# Patient Record
Sex: Female | Born: 1955 | Race: Black or African American | Hispanic: No | Marital: Married | Smoking: Current some day smoker
Health system: Southern US, Community
[De-identification: ages and names within clinical notes are randomized; demographics above are authoritative.]

## PROBLEM LIST (undated history)

## (undated) DIAGNOSIS — I10 Essential (primary) hypertension: Secondary | ICD-10-CM

## (undated) DIAGNOSIS — K219 Gastro-esophageal reflux disease without esophagitis: Secondary | ICD-10-CM

## (undated) DIAGNOSIS — E079 Disorder of thyroid, unspecified: Secondary | ICD-10-CM

## (undated) HISTORY — PX: THYROID SURGERY: SHX805

## (undated) HISTORY — DX: Gastro-esophageal reflux disease without esophagitis: K21.9

---

## 2017-02-22 ENCOUNTER — Ambulatory Visit (HOSPITAL_COMMUNITY)
Admission: RE | Admit: 2017-02-22 | Discharge: 2017-02-22 | Disposition: A | Discharge status: Home / Self Care | Payer: BLUE CROSS/BLUE SHIELD | Source: Ambulatory Visit | Attending: Family Medicine | Admitting: Family Medicine

## 2017-02-22 ENCOUNTER — Other Ambulatory Visit (HOSPITAL_COMMUNITY): Payer: Self-pay | Admitting: Family Medicine

## 2017-02-22 DIAGNOSIS — N6489 Other specified disorders of breast: Secondary | ICD-10-CM | POA: Insufficient documentation

## 2017-02-22 DIAGNOSIS — Z1231 Encounter for screening mammogram for malignant neoplasm of breast: Secondary | ICD-10-CM | POA: Insufficient documentation

## 2017-03-05 ENCOUNTER — Ambulatory Visit (HOSPITAL_COMMUNITY)
Admission: RE | Admit: 2017-03-05 | Discharge: 2017-03-05 | Disposition: A | Discharge status: Home / Self Care | Payer: BLUE CROSS/BLUE SHIELD | Source: Ambulatory Visit | Attending: Family Medicine | Admitting: Family Medicine

## 2017-03-05 ENCOUNTER — Other Ambulatory Visit (HOSPITAL_COMMUNITY): Payer: Self-pay | Admitting: Family Medicine

## 2017-03-05 DIAGNOSIS — K5900 Constipation, unspecified: Secondary | ICD-10-CM | POA: Insufficient documentation

## 2017-08-11 ENCOUNTER — Emergency Department (HOSPITAL_COMMUNITY)
Admission: EM | Admit: 2017-08-11 | Discharge: 2017-08-11 | Disposition: A | Discharge status: Home / Self Care | Payer: 59 | Attending: Emergency Medicine | Admitting: Emergency Medicine

## 2017-08-11 ENCOUNTER — Encounter (HOSPITAL_COMMUNITY): Payer: Self-pay | Admitting: Emergency Medicine

## 2017-08-11 ENCOUNTER — Emergency Department (HOSPITAL_COMMUNITY): Payer: 59

## 2017-08-11 DIAGNOSIS — W19XXXA Unspecified fall, initial encounter: Secondary | ICD-10-CM | POA: Insufficient documentation

## 2017-08-11 DIAGNOSIS — Y939 Activity, unspecified: Secondary | ICD-10-CM | POA: Diagnosis not present

## 2017-08-11 DIAGNOSIS — Z23 Encounter for immunization: Secondary | ICD-10-CM | POA: Insufficient documentation

## 2017-08-11 DIAGNOSIS — I10 Essential (primary) hypertension: Secondary | ICD-10-CM | POA: Diagnosis not present

## 2017-08-11 DIAGNOSIS — Y999 Unspecified external cause status: Secondary | ICD-10-CM | POA: Diagnosis not present

## 2017-08-11 DIAGNOSIS — Y929 Unspecified place or not applicable: Secondary | ICD-10-CM | POA: Diagnosis not present

## 2017-08-11 DIAGNOSIS — S80211A Abrasion, right knee, initial encounter: Secondary | ICD-10-CM | POA: Diagnosis not present

## 2017-08-11 DIAGNOSIS — S8991XA Unspecified injury of right lower leg, initial encounter: Secondary | ICD-10-CM | POA: Diagnosis present

## 2017-08-11 HISTORY — DX: Essential (primary) hypertension: I10

## 2017-08-11 HISTORY — DX: Disorder of thyroid, unspecified: E07.9

## 2017-08-11 MED ORDER — MUPIROCIN CALCIUM 2 % EX CREA: 1.0000 "application " | TOPICAL_CREAM | Freq: Two times a day (BID) | CUTANEOUS | 0 refills | Status: DC

## 2017-08-11 MED ORDER — TETANUS-DIPHTH-ACELL PERTUSSIS 5-2.5-18.5 LF-MCG/0.5 IM SUSP
0.5000 mL | Freq: Once | INTRAMUSCULAR | Status: AC
  Administered 2017-08-11: 0.5 mL via INTRAMUSCULAR
  Filled 2017-08-11: qty 0.5

## 2017-08-11 MED ORDER — IBUPROFEN 800 MG PO TABS: 800.0000 mg | ORAL_TABLET | Freq: Three times a day (TID) | ORAL | 0 refills | Status: DC

## 2017-08-11 MED ORDER — MUPIROCIN CALCIUM 2 % EX CREA: TOPICAL_CREAM | Freq: Two times a day (BID) | CUTANEOUS | Status: DC

## 2017-08-11 NOTE — ED Provider Notes (Signed)
[facility] DEPT Provider Note   CSN: [id number] Arrival date & time: 08/11/17  1148     History   Chief Complaint Chief Complaint  Patient presents with  . Wound Check    HPI Ann Reeves is a 61 y.o. female.  The history is provided by the patient. No language interpreter was used.  Wound Check  This is a new problem. The current episode started 2 days ago. The problem occurs constantly. The problem has been gradually worsening. Nothing aggravates the symptoms. Nothing relieves the symptoms. She has tried nothing for the symptoms. The treatment provided no relief.   Pt fell on Friday and hit her knee.  Pt complains of swelling and pain.  Pt has an abrasion that is draining some now Past Medical History:  Diagnosis Date  . Hypertension   . Thyroid disease     There are no active problems to display for this patient.   Past Surgical History:  Procedure Laterality Date  . THYROID SURGERY      OB History    No data available       Home Medications    Prior to Admission medications   Not on File    Family History No family history on file.  Social History Social History  Substance Use Topics  . Smoking status: Not on file  . Smokeless tobacco: Not on file  . Alcohol use Not on file     Allergies   Patient has no known allergies.   Review of Systems Review of Systems  All other systems reviewed and are negative.    Physical Exam Updated Vital Signs BP 119/77 (BP Location: Right Arm)   Pulse 76   Temp (!) 97.3 F (36.3 C) (Oral)   Resp 18   Ht 5\' 6"  (1.676 m)   Wt 68 kg (150 lb)   SpO2 100%   BMI 24.21 kg/m   Physical Exam  Constitutional: She appears well-developed and well-nourished.  HENT:  Head: Normocephalic.  Cardiovascular: Normal rate.   Pulmonary/Chest: Effort normal.  Abdominal: Soft.  Musculoskeletal: Normal range of motion.  Neurological: She is alert.  Skin: There is erythema.  Abrasion right knee,  Slight  erythema,  Drainage form wound  Psychiatric: She has a normal mood and affect.  Nursing note and vitals reviewed.    ED Treatments / Results  Labs (all labs ordered are listed, but only abnormal results are displayed) Labs Reviewed - No data to display  EKG  EKG Interpretation None       Radiology Dg Knee Complete 4 Views Right  Result Date: 08/11/2017 CLINICAL DATA:  Initial encounter for FALL, RIGHT KNEE PAIN, ABRASION, Pt fell on Friday on rt knee, abrasion to rt knee has some yellow drainage around top. HISTORY OF HTN EXAM: RIGHT KNEE - COMPLETE 4+ VIEW COMPARISON:  None. FINDINGS: No acute fracture or dislocation. No joint effusion. Enthesophyte at the quadriceps insertion. IMPRESSION: No acute osseous abnormality. Electronically Signed   By: Abigail Miyamoto M.D.   On: 08/11/2017 14:05    Procedures Procedures (including critical care time)  Medications Ordered in ED Medications  Tdap (BOOSTRIX) injection 0.5 mL (0.5 mLs Intramuscular Given 08/11/17 1311)     Initial Impression / Assessment and Plan / ED Course  I have reviewed the triage vital signs and the nursing notes.  Pertinent labs & imaging results that were available during my care of the patient were reviewed by me and considered in my medical decision making (  see chart for details).       Final Clinical Impressions(s) / ED Diagnoses   Final diagnoses:  Abrasion, right knee, initial encounter    New Prescriptions New Prescriptions   IBUPROFEN (ADVIL,MOTRIN) 800 MG TABLET    Take 1 tablet (800 mg total) by mouth 3 (three) times daily.   An After Visit Summary was printed and given to the patient.   Fransico Meadow, PA-C 08/11/17 1422    Fransico Meadow, [place] 08/11/17 1429    Margette Fast, MD 08/11/17 2025

## 2017-08-11 NOTE — ED Notes (Addendum)
Well healing scar noted to right knee.  Denies fever or chills.  No foul smell noted.  Pt has been cleaning area, using peroxide and neosporin with dressing applied.  Limited ROM with knee, pain increased 6/10 with movement.

## 2017-08-11 NOTE — ED Triage Notes (Signed)
Pt fell on Friday on rt knee, abrasion to rt knee has some yellow drainage around top.

## 2018-02-05 IMAGING — DX DG ABDOMEN 2V
3 series · 3 of 3 positions shown · non-contrast
Comparison: None.

CLINICAL DATA: Constipation.

EXAM:
ABDOMEN - 2 VIEW

[abdomen erect]
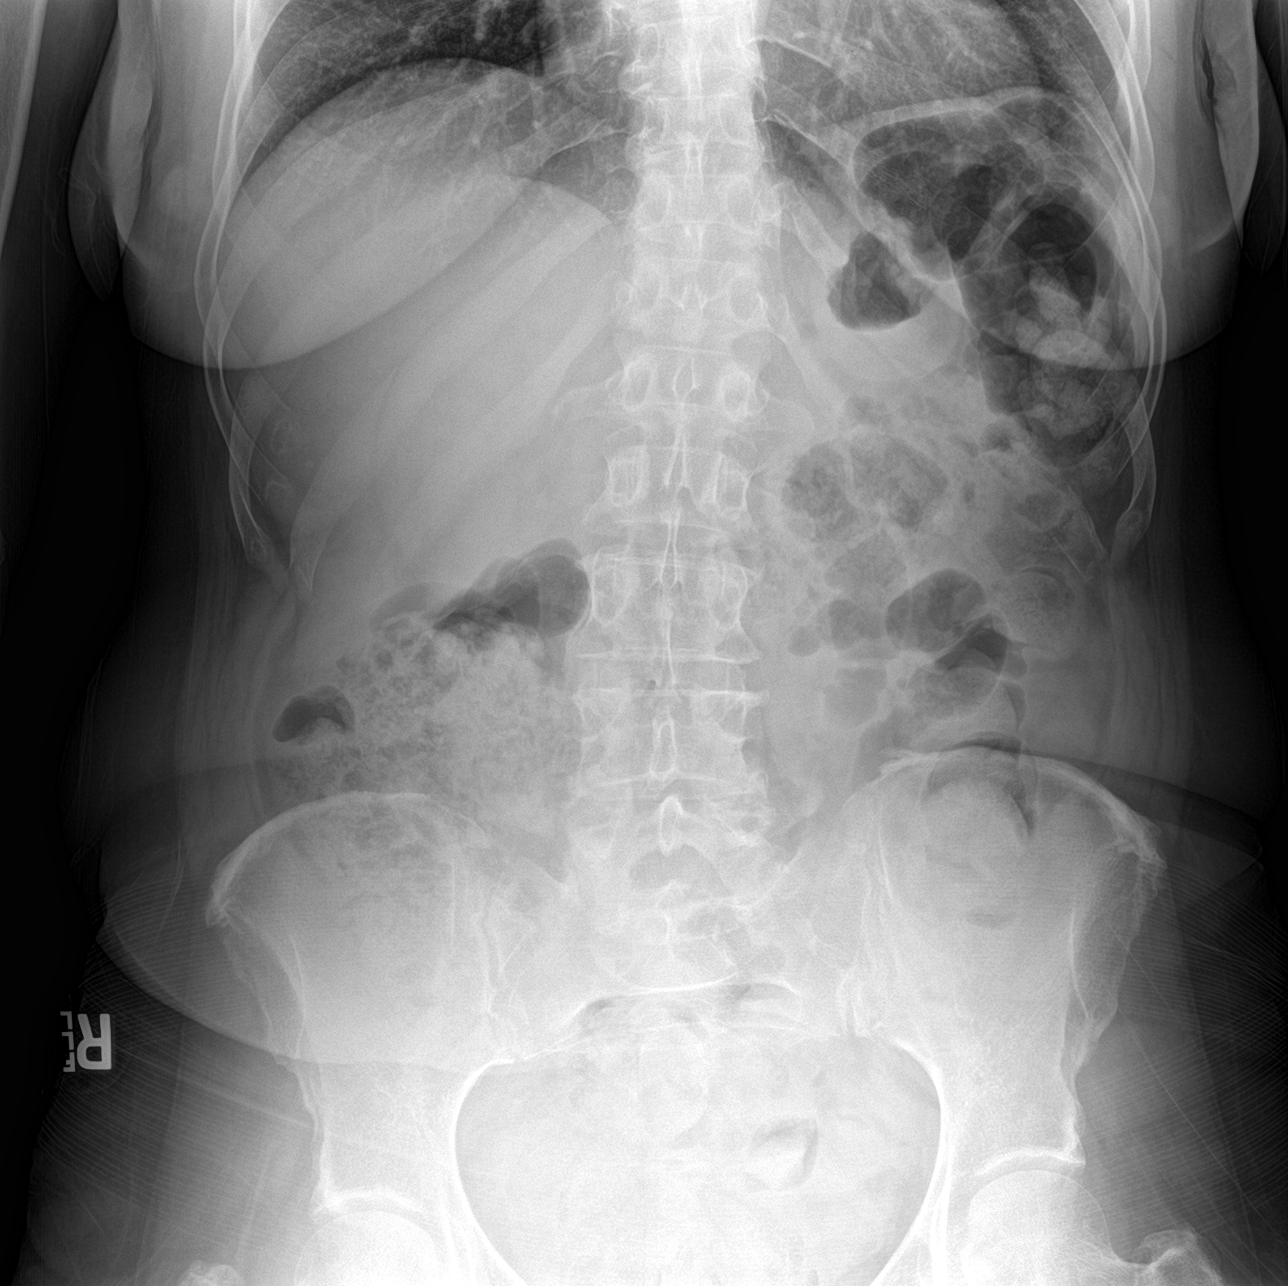

[abdomen supine (1 of 2)]
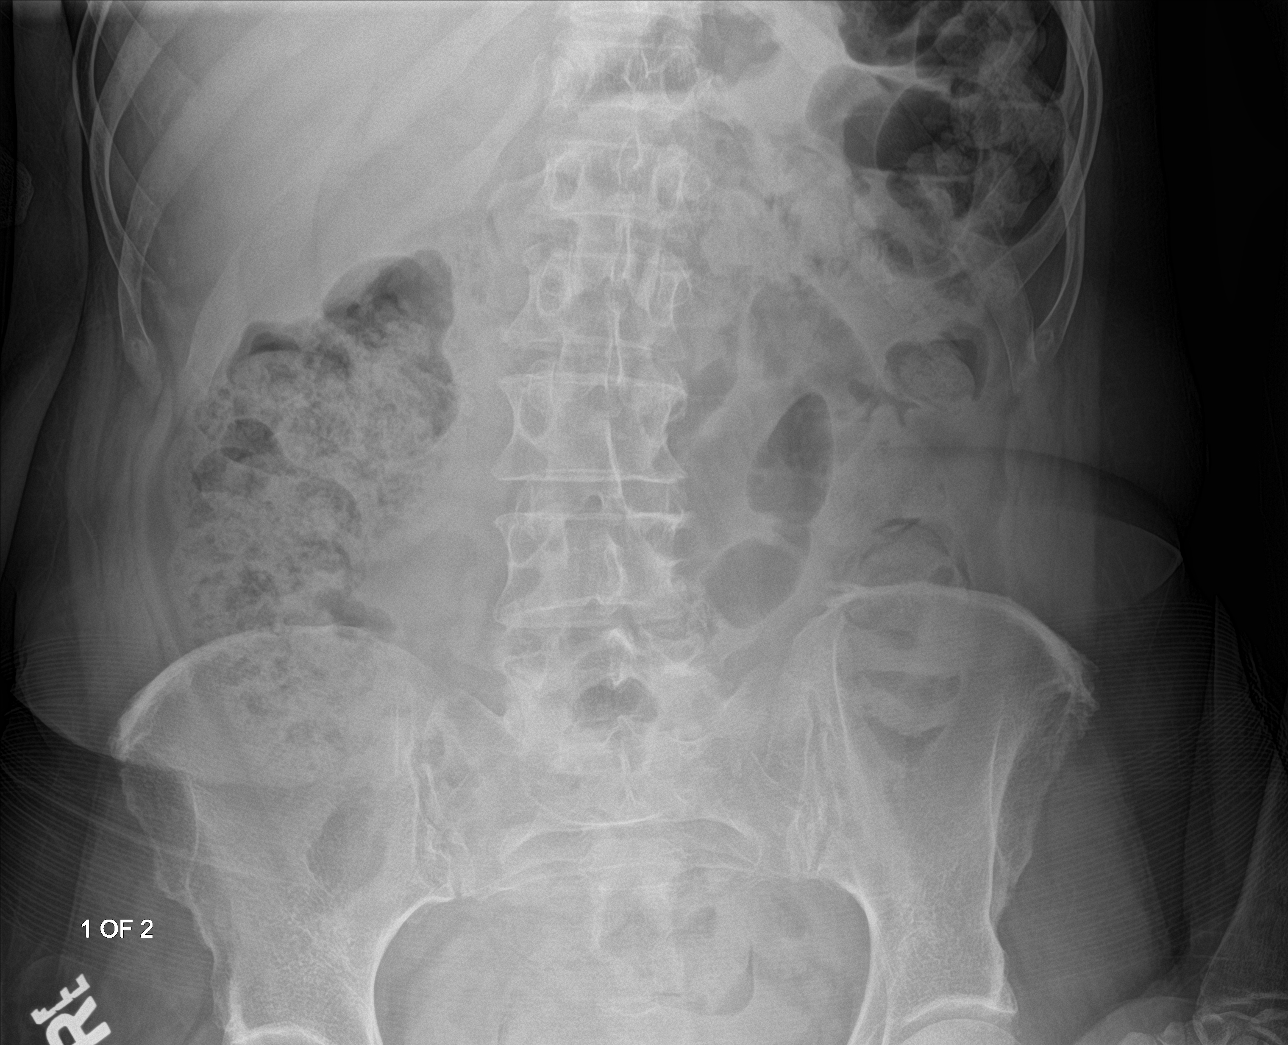

[abdomen supine (2 of 2)]
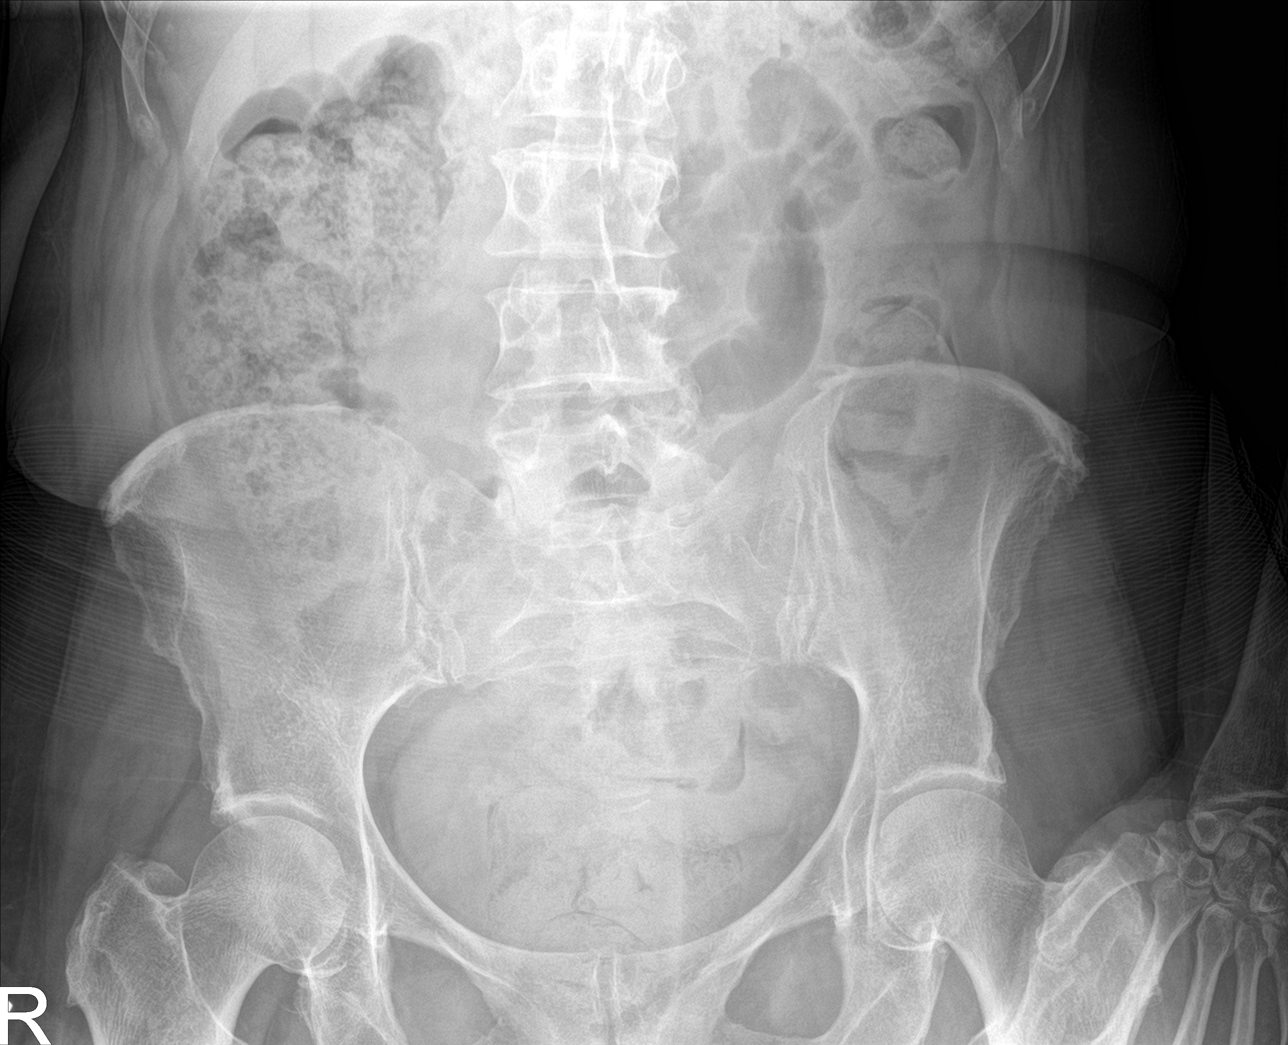

[3 of 3 positions shown; findings below may reference images not displayed]

FINDINGS: No abnormal bowel dilatation is noted. Large amount of stool seen
throughout the colon. There is no evidence of free air. No
radio-opaque calculi or other significant radiographic abnormality
is seen.
IMPRESSION: Large stool burden is noted suggesting constipation.

## 2019-02-04 ENCOUNTER — Other Ambulatory Visit (HOSPITAL_COMMUNITY): Payer: Self-pay | Admitting: Family Medicine

## 2019-02-04 DIAGNOSIS — Z1231 Encounter for screening mammogram for malignant neoplasm of breast: Secondary | ICD-10-CM

## 2019-02-12 ENCOUNTER — Ambulatory Visit (HOSPITAL_COMMUNITY)
Admission: RE | Admit: 2019-02-12 | Discharge: 2019-02-12 | Disposition: A | Discharge status: Home / Self Care | Payer: BLUE CROSS/BLUE SHIELD | Source: Ambulatory Visit | Attending: Family Medicine | Admitting: Family Medicine

## 2019-02-12 ENCOUNTER — Encounter (HOSPITAL_COMMUNITY): Payer: Self-pay

## 2019-02-12 ENCOUNTER — Other Ambulatory Visit: Payer: Self-pay

## 2019-02-12 DIAGNOSIS — Z1231 Encounter for screening mammogram for malignant neoplasm of breast: Secondary | ICD-10-CM | POA: Diagnosis not present

## 2020-01-05 ENCOUNTER — Other Ambulatory Visit (HOSPITAL_COMMUNITY): Payer: Self-pay | Admitting: Family Medicine

## 2020-01-05 DIAGNOSIS — Z1231 Encounter for screening mammogram for malignant neoplasm of breast: Secondary | ICD-10-CM

## 2020-02-18 ENCOUNTER — Other Ambulatory Visit: Payer: Self-pay

## 2020-02-18 ENCOUNTER — Ambulatory Visit (HOSPITAL_COMMUNITY)
Admission: RE | Admit: 2020-02-18 | Discharge: 2020-02-18 | Disposition: A | Discharge status: Home / Self Care | Payer: BLUE CROSS/BLUE SHIELD | Source: Ambulatory Visit | Attending: Family Medicine | Admitting: Family Medicine

## 2020-02-18 DIAGNOSIS — Z1231 Encounter for screening mammogram for malignant neoplasm of breast: Secondary | ICD-10-CM | POA: Insufficient documentation

## 2020-03-24 ENCOUNTER — Ambulatory Visit (HOSPITAL_COMMUNITY): Payer: BLUE CROSS/BLUE SHIELD

## 2020-04-07 ENCOUNTER — Ambulatory Visit (HOSPITAL_COMMUNITY): Payer: No Typology Code available for payment source

## 2020-08-31 ENCOUNTER — Ambulatory Visit (HOSPITAL_COMMUNITY)
Admission: RE | Admit: 2020-08-31 | Discharge: 2020-08-31 | Disposition: A | Discharge status: Home / Self Care | Payer: Medicare Other | Source: Ambulatory Visit | Attending: Family Medicine | Admitting: Family Medicine

## 2020-08-31 ENCOUNTER — Other Ambulatory Visit: Payer: Self-pay

## 2020-08-31 DIAGNOSIS — Z1231 Encounter for screening mammogram for malignant neoplasm of breast: Secondary | ICD-10-CM | POA: Diagnosis not present

## 2021-01-13 ENCOUNTER — Encounter: Payer: Self-pay | Admitting: Internal Medicine

## 2021-02-22 ENCOUNTER — Ambulatory Visit (INDEPENDENT_AMBULATORY_CARE_PROVIDER_SITE_OTHER): Payer: Medicare Other | Admitting: Nurse Practitioner

## 2021-02-22 ENCOUNTER — Other Ambulatory Visit: Payer: Self-pay

## 2021-02-22 ENCOUNTER — Encounter: Payer: Self-pay | Admitting: Nurse Practitioner

## 2021-02-22 DIAGNOSIS — R1013 Epigastric pain: Secondary | ICD-10-CM | POA: Diagnosis not present

## 2021-02-22 DIAGNOSIS — K279 Peptic ulcer, site unspecified, unspecified as acute or chronic, without hemorrhage or perforation: Secondary | ICD-10-CM | POA: Insufficient documentation

## 2021-02-22 DIAGNOSIS — R109 Unspecified abdominal pain: Secondary | ICD-10-CM | POA: Insufficient documentation

## 2021-02-22 DIAGNOSIS — K219 Gastro-esophageal reflux disease without esophagitis: Secondary | ICD-10-CM | POA: Insufficient documentation

## 2021-02-22 MED ORDER — DEXLANSOPRAZOLE 60 MG PO CPDR: 60.0000 mg | DELAYED_RELEASE_CAPSULE | Freq: Every day | ORAL | 3 refills | Status: DC

## 2021-02-22 MED ORDER — LIDOCAINE VISCOUS HCL 2 % MT SOLN: 15.0000 mL | Freq: Two times a day (BID) | OROMUCOSAL | 1 refills | Status: DC | PRN

## 2021-02-22 NOTE — H&P (View-Only) (Signed)
Primary Care Physician:  Lucia Gaskins, MD Primary Gastroenterologist:  Dr. Abbey Chatters  Chief Complaint  Patient presents with  . Gastroesophageal Reflux    Burning, pain in chest. On pantoprazole x 1 yr but does not help    HPI:   Ann Reeves is a 65 y.o. female who presents on referral from primary care for GERD.  No history of colonoscopy or endoscopy in our system.  Today she states she doing okay overall. States she's having significant gERD symptoms. Has made appropriate dietary/lifestyle changes. She is not obese. Has been on Protonix twice a day for about a year with no improvement. She also elevates the head of her bed at night. Typical symptoms include chest/esophageal burning, epigastric pain. Denies N/V, hematochezia, melena, fever, chills, unintentional weight loss. Denies URI or flu-like symptoms. Denies loss of sense of taste or smell. The patient has received COVID-19 vaccination(s). They have also had a booster dose. Denies chest pain, dyspnea, dizziness, lightheadedness, syncope, near syncope. Denies any other upper or lower GI symptoms.   Last colonoscopy was last year at [facility], although records aren't available.  Past Medical History:  Diagnosis Date  . Hypertension   . Thyroid disease     Past Surgical History:  Procedure Laterality Date  . THYROID SURGERY      Current Outpatient Medications  Medication Sig Dispense Refill  . dexlansoprazole (DEXILANT) 60 MG capsule Take 1 capsule (60 mg total) by mouth daily. 90 capsule 3  . levothyroxine (EUTHYROX) 175 MCG tablet Take 175 mcg by mouth daily before breakfast.    . lidocaine (XYLOCAINE) 2 % solution Use as directed 15 mLs in the mouth or throat 2 (two) times daily as needed (chest/esophagus pain. May cause mouth/tongue numbness). 100 mL 1  . meloxicam (MOBIC) 7.5 MG tablet Take 7.5 mg by mouth daily.     No current facility-administered medications for this visit.    Allergies as of  02/22/2021  . (No Known Allergies)    History reviewed. No pertinent family history.  Social History   Socioeconomic History  . Marital status: Married    Spouse name: Not on file  . Number of children: Not on file  . Years of education: Not on file  . Highest education level: Not on file  Occupational History  . Not on file  Tobacco Use  . Smoking status: Former Smoker    Types: Cigarettes  . Smokeless tobacco: Never Used  Substance and Sexual Activity  . Alcohol use: Yes    Comment: occas  . Drug use: Never  . Sexual activity: Not on file  Other Topics Concern  . Not on file  Social History Narrative  . Not on file   Social Determinants of Health   Financial Resource Strain: Not on file  Food Insecurity: Not on file  Transportation Needs: Not on file  Physical Activity: Not on file  Stress: Not on file  Social Connections: Not on file  Intimate Partner Violence: Not on file    Subjective: Review of Systems  Constitutional: Negative for chills, fever, malaise/fatigue and weight loss.  HENT: Negative for congestion and sore throat.   Respiratory: Negative for cough and shortness of breath.   Cardiovascular: Negative for chest pain and palpitations.  Gastrointestinal: Positive for heartburn. Negative for abdominal pain, blood in stool, diarrhea, melena, nausea and vomiting.  Musculoskeletal: Negative for joint pain and myalgias.  Skin: Negative for rash.  Neurological: Negative for dizziness and weakness.  Endo/Heme/Allergies: Does not bruise/bleed easily.  Psychiatric/Behavioral: Negative for depression. The patient is not nervous/anxious.   All other systems reviewed and are negative.      Objective: BP 131/83   Pulse 66   Temp 97.9 F (36.6 C)   Ht _0  (1.676 m)   Wt 172 lb 12.8 oz (78.4 kg)   BMI 27.89 kg/m  Physical Exam Vitals and nursing note reviewed.  Constitutional:      General: She is not in acute distress.    Appearance: Normal  appearance. She is well-developed and normal weight. She is not ill-appearing, toxic-appearing or diaphoretic.  HENT:     Head: Normocephalic and atraumatic.     Nose: No congestion or rhinorrhea.  Eyes:     General: No scleral icterus. Cardiovascular:     Rate and Rhythm: Normal rate and regular rhythm.     Heart sounds: Normal heart sounds.  Pulmonary:     Effort: Pulmonary effort is normal. No respiratory distress.     Breath sounds: Normal breath sounds.  Abdominal:     General: Bowel sounds are normal.     Palpations: Abdomen is soft. There is no hepatomegaly, splenomegaly or mass.     Tenderness: There is no abdominal tenderness. There is no guarding or rebound.     Hernia: No hernia is present.  Skin:    General: Skin is warm and dry.     Coloration: Skin is not jaundiced.     Findings: No rash.  Neurological:     General: No focal deficit present.     Mental Status: She is alert and oriented to person, place, and time.  Psychiatric:        Attention and Perception: Attention normal.        Mood and Affect: Mood normal.        Speech: Speech normal.        Behavior: Behavior normal.        Thought Content: Thought content normal.        Cognition and Memory: Cognition and memory normal.      Assessment:  Very pleasant six 54-year-old female presents on referral from primary care for GERD.  No red flag/warning signs or symptoms today.  GERD: Chronic 1+ year history of reflux.  Currently on Protonix twice daily with no improvement.  She has made all the appropriate lifestyle changes and trigger avoidance, elevating the head of her bed.  Still with no relief.  At this point because of ongoing symptoms despite appropriate PPI dosing and lifestyle changes we will proceed with an EGD to further evaluate for other possible etiologies.  Cannot rule out H. pylori infection, eosinophilic esophagitis, Candida esophagus, gastritis, esophagitis, less likely malignant process.  Further  recommendations to follow.  Epigastric pain: Symptoms likely related to her ongoing GERD.  Further recommendations will follow her EGD as described above.   Proceed with EGD on propofol/MAC with Dr. Abbey Chatters on propofol/MAC in near future: the risks, benefits, and alternatives have been discussed with the patient in detail. The patient states understanding and desires to proceed.  ASA II   Plan: 1. EGD as described above 2. Stop Protonix 3. Start Dexilant 60 mg daily 4. Viscous lidocaine up to twice a day as needed for breakthrough symptoms 5. Follow-up in 2 months    Thank you for allowing [place] to participate in the care of Danella Deis, DNP, AGNP-C Adult & Gerontological Nurse Practitioner [facility]  02/22/2021 8:37 AM   Disclaimer: This note was dictated with voice recognition software. Similar sounding words can inadvertently be transcribed and may not be corrected upon review.

## 2021-02-22 NOTE — Progress Notes (Signed)
Primary Care Physician:  Lucia Gaskins, MD Primary Gastroenterologist:  Dr. Abbey Chatters  Chief Complaint  Patient presents with  . Gastroesophageal Reflux    Burning, pain in chest. On pantoprazole x 1 yr but does not help    HPI:   Ann Reeves is a 65 y.o. female who presents on referral from primary care for GERD.  No history of colonoscopy or endoscopy in our system.  Today she states she doing okay overall. States she's having significant gERD symptoms. Has made appropriate dietary/lifestyle changes. She is not obese. Has been on Protonix twice a day for about a year with no improvement. She also elevates the head of her bed at night. Typical symptoms include chest/esophageal burning, epigastric pain. Denies N/V, hematochezia, melena, fever, chills, unintentional weight loss. Denies URI or flu-like symptoms. Denies loss of sense of taste or smell. The patient has received COVID-19 vaccination(s). They have also had a booster dose. Denies chest pain, dyspnea, dizziness, lightheadedness, syncope, near syncope. Denies any other upper or lower GI symptoms.   Last colonoscopy was last year at [facility], although records aren't available.  Past Medical History:  Diagnosis Date  . Hypertension   . Thyroid disease     Past Surgical History:  Procedure Laterality Date  . THYROID SURGERY      Current Outpatient Medications  Medication Sig Dispense Refill  . dexlansoprazole (DEXILANT) 60 MG capsule Take 1 capsule (60 mg total) by mouth daily. 90 capsule 3  . levothyroxine (EUTHYROX) 175 MCG tablet Take 175 mcg by mouth daily before breakfast.    . lidocaine (XYLOCAINE) 2 % solution Use as directed 15 mLs in the mouth or throat 2 (two) times daily as needed (chest/esophagus pain. May cause mouth/tongue numbness). 100 mL 1  . meloxicam (MOBIC) 7.5 MG tablet Take 7.5 mg by mouth daily.     No current facility-administered medications for this visit.    Allergies as of  02/22/2021  . (No Known Allergies)    History reviewed. No pertinent family history.  Social History   Socioeconomic History  . Marital status: Married    Spouse name: Not on file  . Number of children: Not on file  . Years of education: Not on file  . Highest education level: Not on file  Occupational History  . Not on file  Tobacco Use  . Smoking status: Former Smoker    Types: Cigarettes  . Smokeless tobacco: Never Used  Substance and Sexual Activity  . Alcohol use: Yes    Comment: occas  . Drug use: Never  . Sexual activity: Not on file  Other Topics Concern  . Not on file  Social History Narrative  . Not on file   Social Determinants of Health   Financial Resource Strain: Not on file  Food Insecurity: Not on file  Transportation Needs: Not on file  Physical Activity: Not on file  Stress: Not on file  Social Connections: Not on file  Intimate Partner Violence: Not on file    Subjective: Review of Systems  Constitutional: Negative for chills, fever, malaise/fatigue and weight loss.  HENT: Negative for congestion and sore throat.   Respiratory: Negative for cough and shortness of breath.   Cardiovascular: Negative for chest pain and palpitations.  Gastrointestinal: Positive for heartburn. Negative for abdominal pain, blood in stool, diarrhea, melena, nausea and vomiting.  Musculoskeletal: Negative for joint pain and myalgias.  Skin: Negative for rash.  Neurological: Negative for dizziness and weakness.  Endo/Heme/Allergies: Does not bruise/bleed easily.  Psychiatric/Behavioral: Negative for depression. The patient is not nervous/anxious.   All other systems reviewed and are negative.      Objective: BP 131/83   Pulse 66   Temp 97.9 F (36.6 C)   Ht _0  (1.676 m)   Wt 172 lb 12.8 oz (78.4 kg)   BMI 27.89 kg/m  Physical Exam Vitals and nursing note reviewed.  Constitutional:      General: She is not in acute distress.    Appearance: Normal  appearance. She is well-developed and normal weight. She is not ill-appearing, toxic-appearing or diaphoretic.  HENT:     Head: Normocephalic and atraumatic.     Nose: No congestion or rhinorrhea.  Eyes:     General: No scleral icterus. Cardiovascular:     Rate and Rhythm: Normal rate and regular rhythm.     Heart sounds: Normal heart sounds.  Pulmonary:     Effort: Pulmonary effort is normal. No respiratory distress.     Breath sounds: Normal breath sounds.  Abdominal:     General: Bowel sounds are normal.     Palpations: Abdomen is soft. There is no hepatomegaly, splenomegaly or mass.     Tenderness: There is no abdominal tenderness. There is no guarding or rebound.     Hernia: No hernia is present.  Skin:    General: Skin is warm and dry.     Coloration: Skin is not jaundiced.     Findings: No rash.  Neurological:     General: No focal deficit present.     Mental Status: She is alert and oriented to person, place, and time.  Psychiatric:        Attention and Perception: Attention normal.        Mood and Affect: Mood normal.        Speech: Speech normal.        Behavior: Behavior normal.        Thought Content: Thought content normal.        Cognition and Memory: Cognition and memory normal.      Assessment:  Very pleasant six 49-year-old female presents on referral from primary care for GERD.  No red flag/warning signs or symptoms today.  GERD: Chronic 1+ year history of reflux.  Currently on Protonix twice daily with no improvement.  She has made all the appropriate lifestyle changes and trigger avoidance, elevating the head of her bed.  Still with no relief.  At this point because of ongoing symptoms despite appropriate PPI dosing and lifestyle changes we will proceed with an EGD to further evaluate for other possible etiologies.  Cannot rule out H. pylori infection, eosinophilic esophagitis, Candida esophagus, gastritis, esophagitis, less likely malignant process.  Further  recommendations to follow.  Epigastric pain: Symptoms likely related to her ongoing GERD.  Further recommendations will follow her EGD as described above.   Proceed with EGD on propofol/MAC with Dr. Abbey Chatters on propofol/MAC in near future: the risks, benefits, and alternatives have been discussed with the patient in detail. The patient states understanding and desires to proceed.  ASA II   Plan: 1. EGD as described above 2. Stop Protonix 3. Start Dexilant 60 mg daily 4. Viscous lidocaine up to twice a day as needed for breakthrough symptoms 5. Follow-up in 2 months    Thank you for allowing [place] to participate in the care of Danella Deis, DNP, AGNP-C Adult & Gerontological Nurse Practitioner [facility]  02/22/2021 8:37 AM   Disclaimer: This note was dictated with voice recognition software. Similar sounding words can inadvertently be transcribed and may not be corrected upon review.

## 2021-02-22 NOTE — Patient Instructions (Signed)
Your health issues we discussed today were:   GERD (reflux/heartburn) with upper abdominal pain: 1. I am sorry you are not having any improvement with your medications 2. Stop taking Protonix 3. I sent a prescription for Dexilant 60 mg to your pharmacy.  Take this once a day, pursing in the morning on an empty stomach 4. Have also sent a prescription for viscous lidocaine (a thick lidocaine liquid) 5. You can take the lidocaine up to twice a day to help with any "breakthrough" heartburn symptoms and burning 6. As we discussed, you may want to try to drink this with the [place]hl pointed toward the back your throat 7. You may have tongue or mouth numbness with this medication, which is normal 8. Call us for any worsening or severe symptoms 9. We will schedule your upper endoscopy for you 10. Further recommendations will follow  Overall I recommend:  1. Continue other current medications 2. Return for follow-up in 2 months 3. Call us for any questions or concerns   ---------------------------------------------------------------  I am glad you have gotten your COVID-19 vaccination!  Even though you are fully vaccinated you should continue to follow CDC and state/local guidelines.  ---------------------------------------------------------------   At [facility] we value your feedback. You may receive a survey about your visit today. Please share your experience as we strive to create trusting relationships with our patients to provide genuine, compassionate, quality care.  We appreciate your understanding and patience as we review any laboratory studies, imaging, and other diagnostic tests that are ordered as we care for you. Our office policy is 5 business days for review of these results, and any emergent or urgent results are addressed in a timely manner for your best interest. If you do not hear from our office in 1 week, please contact us.   We also encourage the use of  MyChart, which contains your medical information for your review as well. If you are not enrolled in this feature, an access code is on this after visit summary for your convenience. Thank you for allowing [place] to be involved in your care.  It was great to see you today!  I hope you have a great spring!!

## 2021-02-24 ENCOUNTER — Other Ambulatory Visit (HOSPITAL_COMMUNITY)
Admission: RE | Admit: 2021-02-24 | Discharge: 2021-02-24 | Disposition: A | Discharge status: Home / Self Care | Payer: Medicare Other | Source: Ambulatory Visit | Attending: Internal Medicine | Admitting: Internal Medicine

## 2021-02-24 ENCOUNTER — Other Ambulatory Visit: Payer: Self-pay

## 2021-02-24 DIAGNOSIS — B9681 Helicobacter pylori [H. pylori] as the cause of diseases classified elsewhere: Secondary | ICD-10-CM

## 2021-02-24 DIAGNOSIS — Z01812 Encounter for preprocedural laboratory examination: Secondary | ICD-10-CM | POA: Diagnosis present

## 2021-02-24 DIAGNOSIS — Z20822 Contact with and (suspected) exposure to covid-19: Secondary | ICD-10-CM | POA: Insufficient documentation

## 2021-02-24 HISTORY — DX: Helicobacter pylori (H. pylori) as the cause of diseases classified elsewhere: B96.81

## 2021-02-24 LAB — SARS CORONAVIRUS 2 (TAT 6-24 HRS): SARS Coronavirus 2: NEGATIVE

## 2021-02-27 ENCOUNTER — Encounter (HOSPITAL_COMMUNITY)
Admission: RE | Disposition: A | Discharge status: Home / Self Care | Payer: Self-pay | Source: Home / Self Care | Attending: Internal Medicine

## 2021-02-27 ENCOUNTER — Ambulatory Visit (HOSPITAL_COMMUNITY): Payer: Medicare Other | Admitting: Certified Registered Nurse Anesthetist

## 2021-02-27 ENCOUNTER — Ambulatory Visit (HOSPITAL_COMMUNITY)
Admission: RE | Admit: 2021-02-27 | Discharge: 2021-02-27 | Disposition: A | Discharge status: Home / Self Care | Payer: Medicare Other | Attending: Internal Medicine | Admitting: Internal Medicine

## 2021-02-27 ENCOUNTER — Other Ambulatory Visit: Payer: Self-pay

## 2021-02-27 ENCOUNTER — Encounter (HOSPITAL_COMMUNITY): Payer: Self-pay

## 2021-02-27 DIAGNOSIS — Z87891 Personal history of nicotine dependence: Secondary | ICD-10-CM | POA: Insufficient documentation

## 2021-02-27 DIAGNOSIS — E079 Disorder of thyroid, unspecified: Secondary | ICD-10-CM | POA: Diagnosis not present

## 2021-02-27 DIAGNOSIS — Z791 Long term (current) use of non-steroidal anti-inflammatories (NSAID): Secondary | ICD-10-CM | POA: Diagnosis not present

## 2021-02-27 DIAGNOSIS — I10 Essential (primary) hypertension: Secondary | ICD-10-CM | POA: Insufficient documentation

## 2021-02-27 DIAGNOSIS — B9681 Helicobacter pylori [H. pylori] as the cause of diseases classified elsewhere: Secondary | ICD-10-CM | POA: Diagnosis not present

## 2021-02-27 DIAGNOSIS — K2970 Gastritis, unspecified, without bleeding: Secondary | ICD-10-CM | POA: Insufficient documentation

## 2021-02-27 DIAGNOSIS — K219 Gastro-esophageal reflux disease without esophagitis: Secondary | ICD-10-CM | POA: Insufficient documentation

## 2021-02-27 DIAGNOSIS — R1013 Epigastric pain: Secondary | ICD-10-CM | POA: Insufficient documentation

## 2021-02-27 DIAGNOSIS — Z79899 Other long term (current) drug therapy: Secondary | ICD-10-CM | POA: Insufficient documentation

## 2021-02-27 HISTORY — PX: ESOPHAGOGASTRODUODENOSCOPY (EGD) WITH PROPOFOL: SHX5813

## 2021-02-27 HISTORY — PX: BIOPSY: SHX5522

## 2021-02-27 SURGERY — ESOPHAGOGASTRODUODENOSCOPY (EGD) WITH PROPOFOL
Anesthesia: General

## 2021-02-27 MED ORDER — LIDOCAINE HCL (PF) 2 % IJ SOLN
INTRAMUSCULAR | Status: AC
  Filled 2021-02-27: qty 5

## 2021-02-27 MED ORDER — PROPOFOL 10 MG/ML IV BOLUS
INTRAVENOUS | Status: DC | PRN
  Administered 2021-02-27: 100 mg via INTRAVENOUS
  Administered 2021-02-27: 50 mg via INTRAVENOUS
  Administered 2021-02-27: 20 mg via INTRAVENOUS

## 2021-02-27 MED ORDER — LIDOCAINE HCL (CARDIAC) PF 100 MG/5ML IV SOSY
PREFILLED_SYRINGE | INTRAVENOUS | Status: DC | PRN
  Administered 2021-02-27: 50 mg via INTRAVENOUS

## 2021-02-27 MED ORDER — PROPOFOL 10 MG/ML IV BOLUS
INTRAVENOUS | Status: AC
  Filled 2021-02-27: qty 40

## 2021-02-27 MED ORDER — LACTATED RINGERS IV SOLN: INTRAVENOUS | Status: DC

## 2021-02-27 NOTE — Transfer of Care (Signed)
Immediate Anesthesia Transfer of Care Note  Patient: Ann Reeves  Procedure(s) Performed: ESOPHAGOGASTRODUODENOSCOPY (EGD) WITH PROPOFOL (N/A ) BIOPSY  Patient Location: PACU  Anesthesia Type:General  Level of Consciousness: awake, alert  and oriented  Airway & Oxygen Therapy: Patient Spontanous Breathing  Post-op Assessment: Report given to RN, Post -op Vital signs reviewed and stable and Patient moving all extremities X 4  Post vital signs: Reviewed and stable  Last Vitals:  Vitals Value Taken Time  BP    Temp    Pulse    Resp    SpO2      Last Pain:  Vitals:   02/27/21 0953  TempSrc:   PainSc: 4       Patients Stated Pain Goal: 8 (73/[id number]/84 [id number])  Complications: No complications documented.

## 2021-02-27 NOTE — Discharge Instructions (Addendum)
EGD Discharge instructions Please read the instructions outlined below and refer to this sheet in the next few weeks. These discharge instructions provide you with general information on caring for yourself after you leave the hospital. Your doctor may also give you specific instructions. While your treatment has been planned according to the most current medical practices available, unavoidable complications occasionally occur. If you have any problems or questions after discharge, please call your doctor. ACTIVITY  You may resume your regular activity but move at a slower pace for the next 24 hours.   Take frequent rest periods for the next 24 hours.   Walking will help expel (get rid of) the air and reduce the bloated feeling in your abdomen.   No driving for 24 hours (because of the anesthesia (medicine) used during the test).   You may shower.   Do not sign any important legal documents or operate any machinery for 24 hours (because of the anesthesia used during the test).  NUTRITION  Drink plenty of fluids.   You may resume your normal diet.   Begin with a light meal and progress to your normal diet.   Avoid alcoholic beverages for 24 hours or as instructed by your caregiver.  MEDICATIONS  You may resume your normal medications unless your caregiver tells you otherwise.  WHAT YOU CAN EXPECT TODAY  You may experience abdominal discomfort such as a feeling of fullness or "gas" pains.  FOLLOW-UP  Your doctor will discuss the results of your test with you.  SEEK IMMEDIATE MEDICAL ATTENTION IF ANY OF THE FOLLOWING OCCUR:  Excessive nausea (feeling sick to your stomach) and/or vomiting.   Severe abdominal pain and distention (swelling).   Trouble swallowing.   Temperature over 101 F (37.8 C).   Rectal bleeding or vomiting of blood.    Your EGD revealed a mild amount of inflammation in your stomach.  I took biopsies of this to rule infection a bacteria called H.  pylori.  Continue on Dexilant daily.  Avoid NSAIDs as best as you can.  Await pathology results, my office will contact you.  If these are negative, we will further work-up your symptoms on follow-up visit in the GI clinic.  I hope you have a great rest of your week!  Elon Alas. Abbey Chatters, D.O. Gastroenterology and Hepatology [facility]   Gastritis, Adult  Gastritis is swelling (inflammation) of the stomach. Gastritis can develop quickly (acute). It can also develop slowly over time (chronic). It is important to get help for this condition. If you do not get help, your stomach can bleed, and you can get sores (ulcers) in your stomach. What are the causes? This condition may be caused by:  Germs that get to your stomach.  Drinking too much alcohol.  Medicines you are taking.  Too much acid in the stomach.  A disease of the intestines or stomach.  Stress.  An allergic reaction.  Crohn's disease.  Some cancer treatments (radiation). Sometimes the cause of this condition is not known. What are the signs or symptoms? Symptoms of this condition include:  Pain in your stomach.  A burning feeling in your stomach.  Feeling sick to your stomach (nauseous).  Throwing up (vomiting).  Feeling too full after you eat.  Weight loss.  Bad breath.  Throwing up blood.  Blood in your poop (stool). How is this diagnosed? This condition may be diagnosed with:  Your medical history and symptoms.  A physical exam.  Tests. These can  include: ? Blood tests. ? Stool tests. ? A procedure to look inside your stomach (upper endoscopy). ? A test in which a sample of tissue is taken for testing (biopsy). How is this treated? Treatment for this condition depends on what caused it. You may be given:  Antibiotic medicine, if your condition was caused by germs.  H2 blockers and similar medicines, if your condition was caused by too much acid. Follow these  instructions at home: Medicines  Take over-the-counter and prescription medicines only as told by your doctor.  If you were prescribed an antibiotic medicine, take it as told by your doctor. Do not stop taking it even if you start to feel better. Eating and drinking  Eat small meals often, instead of large meals.  Avoid foods and drinks that make your symptoms worse.  Drink enough fluid to keep your pee (urine) pale yellow.   Alcohol use  Do not drink alcohol if: ? Your doctor tells you not to drink. ? You are pregnant, may be pregnant, or are planning to become pregnant.  If you drink alcohol: ? Limit your use to:  0-1 drink a day for women.  0-2 drinks a day for men. ? Be aware of how much alcohol is in your drink. In the [place] one drink equals one 12 oz bottle of beer (355 mL), one 5 oz glass of wine (148 mL), or one 1 oz glass of hard liquor (44 mL). General instructions  Talk with your doctor about ways to manage stress. You can exercise or do deep breathing, meditation, or yoga.  Do not smoke or use products that have nicotine or tobacco. If you need help quitting, ask your doctor.  Keep all follow-up visits as told by your doctor. This is important. Contact a doctor if:  Your symptoms get worse.  Your symptoms go away and then come back. Get help right away if:  You throw up blood or something that looks like coffee grounds.  You have black or dark red poop.  You throw up any time you try to drink fluids.  Your stomach pain gets worse.  You have a fever.  You do not feel better after one week. Summary  Gastritis is swelling (inflammation) of the stomach.  You must get help for this condition. If you do not get help, your stomach can bleed, and you can get sores (ulcers).  This condition is diagnosed with medical history, physical exam, or tests.  You can be treated with medicines for germs or medicines to block too much acid in your stomach. This  information is not intended to replace advice given to you by your health care provider. Make sure you discuss any questions you have with your health care provider. Document Revised: 04/01/2018 Document Reviewed: 04/01/2018 Elsevier Patient Education  [place].

## 2021-02-27 NOTE — Addendum Note (Signed)
Addendum  created 02/27/21 1125 by Lyda Jester, CRNA   Charge Capture section accepted

## 2021-02-27 NOTE — Anesthesia Postprocedure Evaluation (Signed)
Anesthesia Post Note  Patient: Ann Reeves  Procedure(s) Performed: ESOPHAGOGASTRODUODENOSCOPY (EGD) WITH PROPOFOL (N/A ) BIOPSY  Patient location during evaluation: Endoscopy Anesthesia Type: General Level of consciousness: awake and alert Pain management: pain level controlled Vital Signs Assessment: post-procedure vital signs reviewed and stable Respiratory status: spontaneous breathing, nonlabored ventilation, respiratory function stable and patient connected to nasal cannula oxygen Cardiovascular status: blood pressure returned to baseline and stable Postop Assessment: no apparent nausea or vomiting Anesthetic complications: no   No complications documented.   Last Vitals:  Vitals:   02/27/21 0917 02/27/21 1004  BP: (!) 152/83 (!) 101/54  Pulse:  78  Resp: 15 13  Temp: 36.5 C 36.5 C  SpO2: 97% 100%    Last Pain:  Vitals:   02/27/21 1004  TempSrc: Oral  PainSc: 0-No pain                 Talitha Givens

## 2021-02-27 NOTE — Anesthesia Preprocedure Evaluation (Signed)
Anesthesia Evaluation  Patient identified by MRN, date of birth, ID band Patient awake    Reviewed: Allergy & Precautions, H&P , NPO status , Patient's Chart, lab work & pertinent test results, reviewed documented beta blocker date and time   Airway Mallampati: II  TM Distance: >3 FB Neck ROM: full    Dental no notable dental hx.    Pulmonary neg pulmonary ROS, former smoker,    Pulmonary exam normal breath sounds clear to auscultation       Cardiovascular Exercise Tolerance: Good hypertension, negative cardio ROS   Rhythm:regular Rate:Normal     Neuro/Psych negative neurological ROS  negative psych ROS   GI/Hepatic Neg liver ROS, GERD  Medicated,  Endo/Other  negative endocrine ROS  Renal/GU negative Renal ROS  negative genitourinary   Musculoskeletal   Abdominal   Peds  Hematology negative hematology ROS (+)   Anesthesia Other Findings   Reproductive/Obstetrics negative OB ROS                             Anesthesia Physical Anesthesia Plan  ASA: II  Anesthesia Plan: General   Post-op Pain Management:    Induction:   PONV Risk Score and Plan: Propofol infusion  Airway Management Planned:   Additional Equipment:   Intra-op Plan:   Post-operative Plan:   Informed Consent: I have reviewed the patients History and Physical, chart, labs and discussed the procedure including the risks, benefits and alternatives for the proposed anesthesia with the patient or authorized representative who has indicated his/her understanding and acceptance.     Dental Advisory Given  Plan Discussed with: CRNA  Anesthesia Plan Comments:         Anesthesia Quick Evaluation

## 2021-02-27 NOTE — Op Note (Signed)
[facility] Patient Name: Ann Reeves Procedure Date: 02/27/2021 9:43 AM MRN: [id number] Date of Birth: 1956/03/03 Attending MD: Elon Alas. Abbey Chatters DO CSN: [id number] Age: 65 Admit Type: Outpatient Procedure:                Upper GI endoscopy Indications:              Epigastric abdominal pain, Heartburn Providers:                Elon Alas. Abbey Chatters, DO, Gwenlyn Fudge, RN, Dereck Leep, Technician Referring MD:              Medicines:                See the Anesthesia note for documentation of the                            administered medications Complications:            No immediate complications. Estimated Blood Loss:     Estimated blood loss was minimal. Procedure:                Pre-Anesthesia Assessment:                           - The anesthesia plan was to use monitored                            anesthesia care (MAC).                           After obtaining informed consent, the endoscope was                            passed under direct vision. Throughout the                            procedure, the patient's blood pressure, pulse, and                            oxygen saturations were monitored continuously. The                            Endoscope was introduced through the mouth, and                            advanced to the second part of duodenum. The upper                            GI endoscopy was accomplished without difficulty.                            The patient tolerated the procedure well. Scope In: 9:57:03 AM Scope Out: 10:00:17 AM Total Procedure Duration: 0 hours 3 minutes 14 seconds  Findings:      There is no endoscopic evidence of  Barrett's esophagus, bleeding,       esophagitis, hiatal hernia, ulcerations or varices in the entire       esophagus.      Localized moderate inflammation characterized by erosions and erythema       was found in the gastric antrum. Biopsies were taken with a cold forceps        for Helicobacter pylori testing.      The duodenal bulb, first portion of the duodenum and second portion of       the duodenum were normal. Impression:               - Gastritis. Biopsied.                           - Normal duodenal bulb, first portion of the                            duodenum and second portion of the duodenum. Moderate Sedation:      Per Anesthesia Care Recommendation:           - Patient has a contact number available for                            emergencies. The signs and symptoms of potential                            delayed complications were discussed with the                            patient. Return to normal activities tomorrow.                            Written discharge instructions were provided to the                            patient.                           - Resume previous diet.                           - Continue present medications.                           - Await pathology results.                           - Return to GI clinic as previously scheduled.                            Consider [place] to work up for biliary colic. Procedure Code(s):        --- Professional ---                           [phone], Esophagogastroduodenoscopy, flexible,                            transoral;  with biopsy, single or multiple Diagnosis Code(s):        --- Professional ---                           K29.70, Gastritis, unspecified, without bleeding                           R10.13, Epigastric pain                           R12, Heartburn CPT copyright 2019 American Medical Association. All rights reserved. The codes documented in this report are preliminary and upon coder review may  be revised to meet current compliance requirements. Elon Alas. Abbey Chatters, DO [place] Abbey Chatters, DO 02/27/2021 10:07:27 AM This report has been signed electronically. Number of Addenda: 0

## 2021-02-27 NOTE — Interval H&P Note (Signed)
History and Physical Interval Note:  02/27/2021 9:46 AM  Ann Reeves  has presented today for surgery, with the diagnosis of GERD, epigastric pain.  The various methods of treatment have been discussed with the patient and family. After consideration of risks, benefits and other options for treatment, the patient has consented to  Procedure(s) with comments: ESOPHAGOGASTRODUODENOSCOPY (EGD) WITH PROPOFOL (N/A) - PM as a surgical intervention.  The patient's history has been reviewed, patient examined, no change in status, stable for surgery.  I have reviewed the patient's chart and labs.  Questions were answered to the patient's satisfaction.     [name]

## 2021-02-28 LAB — SURGICAL PATHOLOGY

## 2021-03-02 ENCOUNTER — Other Ambulatory Visit: Payer: Self-pay

## 2021-03-02 DIAGNOSIS — K2970 Gastritis, unspecified, without bleeding: Secondary | ICD-10-CM

## 2021-03-02 DIAGNOSIS — B9681 Helicobacter pylori [H. pylori] as the cause of diseases classified elsewhere: Secondary | ICD-10-CM

## 2021-03-02 MED ORDER — AMOXICILL-CLARITHRO-LANSOPRAZ PO MISC: Freq: Two times a day (BID) | ORAL | 0 refills | Status: DC

## 2021-03-03 ENCOUNTER — Encounter (HOSPITAL_COMMUNITY): Payer: Self-pay | Admitting: Internal Medicine

## 2021-03-09 ENCOUNTER — Encounter: Payer: Self-pay | Admitting: Gastroenterology

## 2021-04-25 ENCOUNTER — Ambulatory Visit: Payer: Medicare Other | Admitting: Nurse Practitioner

## 2021-04-29 NOTE — Progress Notes (Deleted)
Referring Provider: Lucia Gaskins, MD Primary Care Physician:  Lucia Gaskins, MD Primary GI Physician: Dr. Abbey Chatters  No chief complaint on file.   HPI:   Ann Reeves is a 65 y.o. female presenting today for follow-up of GERD and epigastric pain s/p EGD.  Last seen in our office 02/22/2021 at the time of initial consult.  She reported significant GERD symptoms on Protonix twice daily for about 1 year with no improvement.  She had made appropriate dietary/lifestyle changes.  Her symptoms included chest/esophageal burning, epigastric pain.  No other GI concerns.  Protonix was switched to [place].  She was prescribed viscous lidocaine twice daily for breakthrough symptoms and scheduled for EGD.  EGD 02/27/2021: Normal examined esophagus, gastritis s/p biopsied, normal examined duodenum. Biopsies positive for H. Pylori. Recommended Prevpac or generic equivalent.  Today:     Past Medical History:  Diagnosis Date  . Hypertension   . Thyroid disease     Past Surgical History:  Procedure Laterality Date  . BIOPSY  02/27/2021   Procedure: BIOPSY;  Surgeon: [name], DO;  Location: [facility];  Service: Endoscopy;;  gastric  . ESOPHAGOGASTRODUODENOSCOPY (EGD) WITH PROPOFOL N/A 02/27/2021   Procedure: ESOPHAGOGASTRODUODENOSCOPY (EGD) WITH PROPOFOL;  Surgeon: [name], DO;  Location: [facility];  Service: Endoscopy;  Laterality: N/A;  PM  . THYROID SURGERY      Current Outpatient Medications  Medication Sig Dispense Refill  . amoxicillin-clarithromycin-lansoprazole (PREVPAC) combo pack Take by mouth 2 (two) times daily for 14 days. Follow package directions. 1 each 0  . cholecalciferol (VITAMIN D) 25 MCG (1000 UNIT) tablet Take 1,000 Units by mouth daily.    Marland Kitchen dexlansoprazole (DEXILANT) 60 MG capsule Take 1 capsule (60 mg total) by mouth daily. 90 capsule 3  . levothyroxine (SYNTHROID) 175 MCG tablet Take 175 mcg by mouth daily before breakfast.    .  lidocaine (XYLOCAINE) 2 % solution Use as directed 15 mLs in the mouth or throat 2 (two) times daily as needed (chest/esophagus pain. May cause mouth/tongue numbness). 100 mL 1  . meloxicam (MOBIC) 7.5 MG tablet Take 7.5 mg by mouth daily.     No current facility-administered medications for this visit.    Allergies as of 05/01/2021  . (No Known Allergies)    No family history on file.  Social History   Socioeconomic History  . Marital status: Married    Spouse name: Not on file  . Number of children: Not on file  . Years of education: Not on file  . Highest education level: Not on file  Occupational History  . Not on file  Tobacco Use  . Smoking status: Former Smoker    Types: Cigarettes  . Smokeless tobacco: Never Used  Substance and Sexual Activity  . Alcohol use: Yes    Comment: occas  . Drug use: Never  . Sexual activity: Not on file  Other Topics Concern  . Not on file  Social History Narrative  . Not on file   Social Determinants of Health   Financial Resource Strain: Not on file  Food Insecurity: Not on file  Transportation Needs: Not on file  Physical Activity: Not on file  Stress: Not on file  Social Connections: Not on file    Review of Systems: Gen: Denies fever, chills, anorexia. Denies fatigue, weakness, weight loss.  CV: Denies chest pain, palpitations, syncope, peripheral edema, and claudication. Resp: Denies dyspnea at rest, cough, wheezing, coughing up blood, and pleurisy.  GI: Denies vomiting blood, jaundice, and fecal incontinence.   Denies dysphagia or odynophagia. Derm: Denies rash, itching, dry skin Psych: Denies depression, anxiety, memory loss, confusion. No homicidal or suicidal ideation.  Heme: Denies bruising, bleeding, and enlarged lymph nodes.  Physical Exam: There were no vitals taken for this visit. General:   Alert and oriented. No distress noted. Pleasant and cooperative.  Head:  Normocephalic and atraumatic. Eyes:   Conjuctiva clear without scleral icterus. Mouth:  Oral mucosa pink and moist. Good dentition. No lesions. Heart:  S1, S2 present without murmurs appreciated. Lungs:  Clear to auscultation bilaterally. No wheezes, rales, or rhonchi. No distress.  Abdomen:  +BS, soft, non-tender and non-distended. No rebound or guarding. No HSM or masses noted. Msk:  Symmetrical without gross deformities. Normal posture. Extremities:  Without edema. Neurologic:  Alert and  oriented x4 Psych:  Alert and cooperative. Normal mood and affect.

## 2021-05-01 ENCOUNTER — Ambulatory Visit: Payer: Medicare Other | Admitting: Gastroenterology

## 2021-07-26 ENCOUNTER — Other Ambulatory Visit: Payer: Self-pay

## 2021-07-26 ENCOUNTER — Encounter: Payer: Self-pay | Admitting: Nurse Practitioner

## 2021-07-26 ENCOUNTER — Ambulatory Visit (INDEPENDENT_AMBULATORY_CARE_PROVIDER_SITE_OTHER): Payer: Medicare Other | Admitting: Nurse Practitioner

## 2021-07-26 VITALS — BP 128/76 | HR 72 | Temp 97.8°F | Ht 66.0 in | Wt 170.0 lb

## 2021-07-26 DIAGNOSIS — K219 Gastro-esophageal reflux disease without esophagitis: Secondary | ICD-10-CM | POA: Diagnosis not present

## 2021-07-26 DIAGNOSIS — E039 Hypothyroidism, unspecified: Secondary | ICD-10-CM

## 2021-07-26 DIAGNOSIS — Z7689 Persons encountering health services in other specified circumstances: Secondary | ICD-10-CM | POA: Diagnosis not present

## 2021-07-26 DIAGNOSIS — M5431 Sciatica, right side: Secondary | ICD-10-CM

## 2021-07-26 DIAGNOSIS — Z139 Encounter for screening, unspecified: Secondary | ICD-10-CM

## 2021-07-26 DIAGNOSIS — A048 Other specified bacterial intestinal infections: Secondary | ICD-10-CM | POA: Diagnosis not present

## 2021-07-26 MED ORDER — ALBUTEROL SULFATE HFA 108 (90 BASE) MCG/ACT IN AERS: 2.0000 | INHALATION_SPRAY | Freq: Four times a day (QID) | RESPIRATORY_TRACT | 0 refills | Status: DC | PRN

## 2021-07-26 MED ORDER — TIZANIDINE HCL 4 MG PO TABS: 4.0000 mg | ORAL_TABLET | Freq: Four times a day (QID) | ORAL | 0 refills | Status: DC | PRN

## 2021-07-26 MED ORDER — PREDNISONE 10 MG PO TABS: ORAL_TABLET | ORAL | 0 refills | Status: AC

## 2021-07-26 NOTE — Patient Instructions (Signed)
Please have fasting labs drawn 2-3 days prior to your appointment so we can discuss the results during your office visit.  

## 2021-07-26 NOTE — Assessment & Plan Note (Signed)
-  received records from Dr. Cindie Laroche

## 2021-07-26 NOTE — Assessment & Plan Note (Signed)
-  she had H. Pylori on biopsy in May and received triple therapy, but still has indigestion -f/u with GI -STOP meloxicam, may make ulcers worse -use tylenol 650 mg TID for arthritis pain instead of NSAIDs

## 2021-07-26 NOTE — Addendum Note (Signed)
Addended by: Demetrius Revel on: 07/26/2021 01:59 PM   Modules accepted: Orders

## 2021-07-26 NOTE — Assessment & Plan Note (Signed)
-  was followed by GI- Dr. Abbey Chatters -has H. Pylori on surgical pathology -encouraged her to f/u with GI for her heartburn

## 2021-07-26 NOTE — Assessment & Plan Note (Signed)
-  check thyroid labs -had surgery for hyperthyroidism in late 1990s

## 2021-07-26 NOTE — Progress Notes (Addendum)
New Patient Office Visit  Subjective:  Patient ID: Ann Reeves, female    DOB: Jul 30, 1956  Age: 65 y.o. MRN: [id number]  CC:  Chief Complaint  Patient presents with   New Patient (Initial Visit)    Here to establish care. Complains of sciatica, R lower side. She has flare ups of this from time to time. Also complains of reflux worsening.    HPI Ann Reeves presents for new patient visit Transferring from Dr. Cindie Laroche. She had physical and labs with Dr. DD.  She had endoscopy in May.  She states her heartburn won't go away and she is having low back pain with sciatica. She was cleaning and states she moved the wrong way yesterday and her back has been hurting since.  She was started on prevpac (amoxicillin-clarithromycin-lansoprazole), but is still having indigestion.  Past Medical History:  Diagnosis Date   Hypertension    Hyperthyroidism- had goiter removed in 1998     Past Surgical History:  Procedure Laterality Date   BIOPSY  02/27/2021   Procedure: BIOPSY;  Surgeon: [name], DO;  Location: [facility];  Service: Endoscopy;;  gastric   ESOPHAGOGASTRODUODENOSCOPY (EGD) WITH PROPOFOL N/A 02/27/2021   Procedure: ESOPHAGOGASTRODUODENOSCOPY (EGD) WITH PROPOFOL;  Surgeon: [name], DO;  Location: [facility];  Service: Endoscopy;  Laterality: N/A;  PM   THYROID SURGERY      Family History  Problem Relation Age of Onset   Cancer Maternal Aunt     Social History   Socioeconomic History   Marital status: Married    Spouse name: Not on file   Number of children: 1   Years of education: Not on file   Highest education level: Not on file  Occupational History   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 [place]  Tobacco Use   Smoking status: Former    Packs/day: 0.50    Years: 10.00    Pack years: 5.00    Types: Cigarettes   Smokeless tobacco: Never  Vaping Use   Vaping Use: Never used  Substance and Sexual Activity   Alcohol use: Yes     Comment: occas- 2-3 drinks per week   Drug use: Never   Sexual activity: Yes    Birth control/protection: Post-menopausal  Other Topics Concern   Not on file  Social History Narrative   Son lives in [place]; family lives in [place], moved to [place] at age 57   Social Determinants of Health   Financial Resource Strain: Not on file  Food Insecurity: Not on file  Transportation Needs: Not on file  Physical Activity: Not on file  Stress: Not on file  Social Connections: Not on file  Intimate Partner Violence: Not on file    ROS Review of Systems  Constitutional: Negative.   Respiratory: Negative.    Cardiovascular: Negative.   Gastrointestinal:  Positive for abdominal pain. Negative for blood in stool, constipation, diarrhea, nausea and vomiting.  Musculoskeletal:  Positive for arthralgias and back pain.       Arthritis affects hands  Psychiatric/Behavioral: Negative.     Objective:   Today's Vitals: BP 128/76 (BP Location: Left Arm, Patient Position: Sitting, Cuff Size: Large)   Pulse 72   Temp 97.8 F (36.6 C) (Oral)   Ht 5' 6"  (1.676 m)   Wt 170 lb (77.1 kg)   SpO2 97%   BMI 27.44 kg/m   Physical Exam Constitutional:      Appearance: Normal appearance.  Cardiovascular:  Rate and Rhythm: Normal rate and regular rhythm.     Pulses: Normal pulses.     Heart sounds: Normal heart sounds.  Pulmonary:     Effort: Pulmonary effort is normal.     Breath sounds: Normal breath sounds.  Musculoskeletal:     Comments: Positive right straight leg raise  Neurological:     Mental Status: She is alert.  Psychiatric:        Mood and Affect: Mood normal.        Behavior: Behavior normal.        Thought Content: Thought content normal.        Judgment: Judgment normal.    Assessment & Plan:   Problem List Items Addressed This Visit       Digestive   GERD (gastroesophageal reflux disease)    -she had H. Pylori on biopsy in May and received triple therapy, but  still has indigestion -f/u with GI -STOP meloxicam, may make ulcers worse -use tylenol 650 mg TID for arthritis pain instead of NSAIDs        Endocrine   Acquired hypothyroidism    -check thyroid labs -had surgery for hyperthyroidism in late 1990s      Relevant Orders   TSH + free T4     Nervous and Auditory   Right sided sciatica   Relevant Medications   predniSONE (DELTASONE) 10 MG tablet   tiZANidine (ZANAFLEX) 4 MG tablet     Other   H. pylori infection    -was followed by GI- Dr. Abbey Chatters -has H. Pylori on surgical pathology -encouraged her to f/u with GI for her heartburn      Relevant Orders   CBC with Differential/Platelet   CMP14+EGFR   Establishing care with new doctor, encounter for - Primary    -received records from Dr. Cindie Laroche      Relevant Orders   Hepatitis C Antibody   CBC with Differential/Platelet   Lipid Panel With LDL/HDL Ratio   CMP14+EGFR   TSH + free T4   Other Visit Diagnoses     Screening due       Relevant Orders   Hepatitis C Antibody       Outpatient Encounter Medications as of 07/26/2021  Medication Sig   albuterol (VENTOLIN HFA) 108 (90 Base) MCG/ACT inhaler Inhale 2 puffs into the lungs every 6 (six) hours as needed for wheezing or shortness of breath.   cholecalciferol (VITAMIN D) 25 MCG (1000 UNIT) tablet Take 1,000 Units by mouth daily.   dexlansoprazole (DEXILANT) 60 MG capsule Take 1 capsule (60 mg total) by mouth daily.   levothyroxine (SYNTHROID) 175 MCG tablet Take 175 mcg by mouth daily before breakfast.   lidocaine (XYLOCAINE) 2 % solution Use as directed 15 mLs in the mouth or throat 2 (two) times daily as needed (chest/esophagus pain. May cause mouth/tongue numbness).   meloxicam (MOBIC) 7.5 MG tablet Take 7.5 mg by mouth daily.   predniSONE (DELTASONE) 10 MG tablet Take 6 tablets (60 mg total) by mouth daily with breakfast for 1 day, THEN 5 tablets (50 mg total) daily with breakfast for 1 day, THEN 4 tablets (40 mg  total) daily with breakfast for 1 day, THEN 3 tablets (30 mg total) daily with breakfast for 1 day, THEN 2 tablets (20 mg total) daily with breakfast for 1 day, THEN 1 tablet (10 mg total) daily with breakfast for 1 day.   tiZANidine (ZANAFLEX) 4 MG tablet Take 1 tablet (4 mg total)  by mouth every 6 (six) hours as needed for muscle spasms.   [DISCONTINUED] amoxicillin-clarithromycin-lansoprazole (PREVPAC) combo pack Take by mouth 2 (two) times daily for 14 days. Follow package directions.   No facility-administered encounter medications on file as of 07/26/2021.    Follow-up: Return in about 2 months (around 09/25/2021) for Physical Exam.   Noreene Larsson, NP

## 2021-08-01 ENCOUNTER — Other Ambulatory Visit (HOSPITAL_COMMUNITY): Payer: Self-pay | Admitting: Nurse Practitioner

## 2021-08-01 DIAGNOSIS — Z1231 Encounter for screening mammogram for malignant neoplasm of breast: Secondary | ICD-10-CM

## 2021-08-18 ENCOUNTER — Ambulatory Visit (INDEPENDENT_AMBULATORY_CARE_PROVIDER_SITE_OTHER): Payer: Medicare Other | Admitting: *Deleted

## 2021-08-18 ENCOUNTER — Other Ambulatory Visit: Payer: Self-pay

## 2021-08-18 DIAGNOSIS — Z0000 Encounter for general adult medical examination without abnormal findings: Secondary | ICD-10-CM

## 2021-08-18 NOTE — Patient Instructions (Signed)
Ann Reeves , Thank you for taking time to come for your Medicare Wellness Visit. I appreciate your ongoing commitment to your health goals. Please review the following plan we discussed and let me know if I can assist you in the future.   Screening recommendations/referrals:  Mammogram: Scheduled Bone Density: Ordered Recommended yearly ophthalmology/optometry visit for glaucoma screening and checkup Recommended yearly dental visit for hygiene and checkup  Vaccinations: Influenza vaccine: Due now Pneumococcal vaccine: Due now  Tdap vaccine: Completed Shingles vaccine: Due now     Advanced directives: Information Provided   Next appointment: 1 year   Preventive Care 14 Years and Older, Female Preventive care refers to lifestyle choices and visits with your health care provider that can promote health and wellness. What does preventive care include? A yearly physical exam. This is also called an annual well check. Dental exams once or twice a year. Routine eye exams. Ask your health care provider how often you should have your eyes checked. Personal lifestyle choices, including: Daily care of your teeth and gums. Regular physical activity. Eating a healthy diet. Avoiding tobacco and drug use. Limiting alcohol use. Practicing safe sex. Taking low-dose aspirin every day. Taking vitamin and mineral supplements as recommended by your health care provider. What happens during an annual well check? The services and screenings done by your health care provider during your annual well check will depend on your age, overall health, lifestyle risk factors, and family history of disease. Counseling  Your health care provider may ask you questions about your: Alcohol use. Tobacco use. Drug use. Emotional well-being. Home and relationship well-being. Sexual activity. Eating habits. History of falls. Memory and ability to understand (cognition). Work and work  Statistician. Reproductive health. Screening  You may have the following tests or measurements: Height, weight, and BMI. Blood pressure. Lipid and cholesterol levels. These may be checked every 5 years, or more frequently if you are over 83 years old. Skin check. Lung cancer screening. You may have this screening every year starting at age 44 if you have a 30-pack-year history of smoking and currently smoke or have quit within the past 15 years. Fecal occult blood test (FOBT) of the stool. You may have this test every year starting at age 2. Flexible sigmoidoscopy or colonoscopy. You may have a sigmoidoscopy every 5 years or a colonoscopy every 10 years starting at age 8. Hepatitis C blood test. Hepatitis B blood test. Sexually transmitted disease (STD) testing. Diabetes screening. This is done by checking your blood sugar (glucose) after you have not eaten for a while (fasting). You may have this done every 1-3 years. Bone density scan. This is done to screen for osteoporosis. You may have this done starting at age 48. Mammogram. This may be done every 1-2 years. Talk to your health care provider about how often you should have regular mammograms. Talk with your health care provider about your test results, treatment options, and if necessary, the need for more tests. Vaccines  Your health care provider may recommend certain vaccines, such as: Influenza vaccine. This is recommended every year. Tetanus, diphtheria, and acellular pertussis (Tdap, Td) vaccine. You may need a Td booster every 10 years. Zoster vaccine. You may need this after age 40. Pneumococcal 13-valent conjugate (PCV13) vaccine. One dose is recommended after age 59. Pneumococcal polysaccharide (PPSV23) vaccine. One dose is recommended after age 78. Talk to your health care provider about which screenings and vaccines you need and how often you need them. This  information is not intended to replace advice given to you by  your health care provider. Make sure you discuss any questions you have with your health care provider. Document Released: 12/09/2015 Document Revised: 08/01/2016 Document Reviewed: 09/13/2015 Elsevier Interactive Patient Education  2017 [place] Prevention in the Home Falls can cause injuries. They can happen to people of all ages. There are many things you can do to make your home safe and to help prevent falls. What can I do on the outside of my home? Regularly fix the edges of walkways and driveways and fix any cracks. Remove anything that might make you trip as you walk through a door, such as a raised step or threshold. Trim any bushes or trees on the path to your home. Use bright outdoor lighting. Clear any walking paths of anything that might make someone trip, such as rocks or tools. Regularly check to see if handrails are loose or broken. Make sure that both sides of any steps have handrails. Any raised decks and porches should have guardrails on the edges. Have any leaves, snow, or ice cleared regularly. Use sand or salt on walking paths during winter. Clean up any spills in your garage right away. This includes oil or grease spills. What can I do in the bathroom? Use night lights. Install grab bars by the toilet and in the tub and shower. Do not use towel bars as grab bars. Use non-skid mats or decals in the tub or shower. If you need to sit down in the shower, use a plastic, non-slip stool. Keep the floor dry. Clean up any water that spills on the floor as soon as it happens. Remove soap buildup in the tub or shower regularly. Attach bath mats securely with double-sided non-slip rug tape. Do not have throw rugs and other things on the floor that can make you trip. What can I do in the bedroom? Use night lights. Make sure that you have a light by your bed that is easy to reach. Do not use any sheets or blankets that are too big for your bed. They should not hang  down onto the floor. Have a firm chair that has side arms. You can use this for support while you get dressed. Do not have throw rugs and other things on the floor that can make you trip. What can I do in the kitchen? Clean up any spills right away. Avoid walking on wet floors. Keep items that you use a lot in easy-to-reach places. If you need to reach something above you, use a strong step stool that has a grab bar. Keep electrical cords out of the way. Do not use floor polish or wax that makes floors slippery. If you must use wax, use non-skid floor wax. Do not have throw rugs and other things on the floor that can make you trip. What can I do with my stairs? Do not leave any items on the stairs. Make sure that there are handrails on both sides of the stairs and use them. Fix handrails that are broken or loose. Make sure that handrails are as long as the stairways. Check any carpeting to make sure that it is firmly attached to the stairs. Fix any carpet that is loose or worn. Avoid having throw rugs at the top or bottom of the stairs. If you do have throw rugs, attach them to the floor with carpet tape. Make sure that you have a light switch at the top  of the stairs and the bottom of the stairs. If you do not have them, ask someone to add them for you. What else can I do to help prevent falls? Wear shoes that: Do not have high heels. Have rubber bottoms. Are comfortable and fit you well. Are closed at the toe. Do not wear sandals. If you use a stepladder: Make sure that it is fully opened. Do not climb a closed stepladder. Make sure that both sides of the stepladder are locked into place. Ask someone to hold it for you, if possible. Clearly mark and make sure that you can see: Any grab bars or handrails. First and last steps. Where the edge of each step is. Use tools that help you move around (mobility aids) if they are needed. These  include: Canes. Walkers. Scooters. Crutches. Turn on the lights when you go into a dark area. Replace any light bulbs as soon as they burn out. Set up your furniture so you have a clear path. Avoid moving your furniture around. If any of your floors are uneven, fix them. If there are any pets around you, be aware of where they are. Review your medicines with your doctor. Some medicines can make you feel dizzy. This can increase your chance of falling. Ask your doctor what other things that you can do to help prevent falls. This information is not intended to replace advice given to you by your health care provider. Make sure you discuss any questions you have with your health care provider. Document Released: 09/08/2009 Document Revised: 04/19/2016 Document Reviewed: 12/17/2014 Elsevier Interactive Patient Education  2017 [employer].

## 2021-08-18 NOTE — Progress Notes (Signed)
Subjective:   Ann Reeves is a 65 y.o. female who presents for Medicare Annual (Subsequent) preventive examination.  I connected with  Ann Reeves on 08/18/21 by an audio enabled telemedicine application and verified that I am speaking with the correct person using two identifiers.   I discussed the limitations, risks, security and privacy concerns of performing an evaluation and management service by telephone and the availability of in person appointments. I also discussed with the patient that there may be a patient responsible charge related to this service. The patient expressed understanding and verbally consented to this telephonic visit.  Review of Systems           Objective:    There were no vitals filed for this visit. There is no height or weight on file to calculate BMI.  Advanced Directives 02/27/2021 08/11/2017  Does Patient Have a Medical Advance Directive? No No  Would patient like information on creating a medical advance directive? Yes (MAU/Ambulatory/Procedural Areas - Information given) -    Current Medications (verified) Outpatient Encounter Medications as of 08/18/2021  Medication Sig   albuterol (VENTOLIN HFA) 108 (90 Base) MCG/ACT inhaler Inhale 2 puffs into the lungs every 6 (six) hours as needed for wheezing or shortness of breath.   cholecalciferol (VITAMIN D) 25 MCG (1000 UNIT) tablet Take 1,000 Units by mouth daily.   dexlansoprazole (DEXILANT) 60 MG capsule Take 1 capsule (60 mg total) by mouth daily.   levothyroxine (SYNTHROID) 175 MCG tablet Take 175 mcg by mouth daily before breakfast.   lidocaine (XYLOCAINE) 2 % solution Use as directed 15 mLs in the mouth or throat 2 (two) times daily as needed (chest/esophagus pain. May cause mouth/tongue numbness).   meloxicam (MOBIC) 7.5 MG tablet Take 7.5 mg by mouth daily.   tiZANidine (ZANAFLEX) 4 MG tablet Take 1 tablet (4 mg total) by mouth every 6 (six) hours as needed for muscle spasms.   No  facility-administered encounter medications on file as of 08/18/2021.    Allergies (verified) Patient has no known allergies.   History: Past Medical History:  Diagnosis Date   Hypertension    Hyperthyroidism- had goiter removed in 1998    Past Surgical History:  Procedure Laterality Date   BIOPSY  02/27/2021   Procedure: BIOPSY;  Surgeon: Ann Harman, DO;  [facility];  Service: Endoscopy;;  gastric   ESOPHAGOGASTRODUODENOSCOPY (EGD) WITH PROPOFOL N/A 02/27/2021   Procedure: ESOPHAGOGASTRODUODENOSCOPY (EGD) WITH PROPOFOL;  Surgeon: Ann Harman, DO;  [facility];  Service: Endoscopy;  Laterality: N/A;  PM   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 [place]  Tobacco Use   Smoking status: Former    Packs/day: 0.50    Years: 10.00    Pack years: 5.00    Types: Cigarettes   Smokeless tobacco: Never  Vaping Use   Vaping Use: Never used  Substance and Sexual Activity   Alcohol use: Yes    Comment: occas- 2-3 drinks per week   Drug use: Never   Sexual activity: Yes    Birth control/protection: Post-menopausal  Other  Topics Concern   Not on file  Social History Narrative   Son lives in [place]; family lives in [place], moved to [place] at age 61   Social Determinants of Health   Financial Resource Strain: Not on file  Food Insecurity: Not on file  Transportation Needs: Not on file  Physical Activity: Not on file  Stress: Not on file  Social Connections: Not on file    Tobacco Counseling Counseling given: Not Answered   Clinical Intake:                 Diabetic?No         Activities of Daily Living No  flowsheet data found.  Patient Care Team: Ann Larsson, NP as PCP - General (Nurse Practitioner) Ann Harman, DO as Consulting Physician (Internal Medicine)  Indicate any recent Medical Services you may have received from other than [facility] providers in the past year (date may be approximate).     Assessment:   This is a routine wellness examination for Ann Reeves.  Hearing/Vision screen No results found.  Dietary issues and exercise activities discussed:     Goals Addressed   None   Depression Screen PHQ 2/9 Scores 07/26/2021  PHQ - 2 Score 0    Fall Risk Fall Risk  07/26/2021  Falls in the past year? 1  Number falls in past yr: 1  Injury with Fall? 0  Risk for fall due to : Impaired balance/gait  Follow up Falls evaluation completed    FALL RISK PREVENTION PERTAINING TO THE HOME:  Any stairs in or around the home? Yes  If so, are there any without handrails? Yes  Home free of loose throw rugs in walkways, pet beds, electrical cords, etc? Yes  Adequate lighting in your home to reduce risk of falls? Yes   ASSISTIVE DEVICES UTILIZED TO PREVENT FALLS:  Life alert? No  Use of a cane, walker or w/c? No  Grab bars in the bathroom? Yes  Shower chair or bench in shower? Yes  Elevated toilet seat or a handicapped toilet? No   TIMED UP AND GO:  Was the test performed? No .  Length of time to ambulate 10 feet: NA sec.     Cognitive Function:        Immunizations Immunization History  Administered Date(s) Administered   Moderna SARS-COV2 Booster Vaccination 01/08/2020, 02/06/2020, 11/07/2020   Tdap 08/11/2017    TDAP status: Up to date  Flu Vaccine status: Due, Education has been provided regarding the importance of this vaccine. Advised may receive this vaccine at local pharmacy or Health Dept. Aware to provide a copy of the vaccination record if obtained from local pharmacy or Health Dept. Verbalized acceptance and understanding.  Pneumococcal vaccine  status: Due, Education has been provided regarding the importance of this vaccine. Advised may receive this vaccine at local pharmacy or Health Dept. Aware to provide a copy of the vaccination record if obtained from local pharmacy or Health Dept. Verbalized acceptance and understanding.  Covid-19 vaccine status: Completed vaccines  Qualifies for Shingles Vaccine? Yes   Zostavax completed No   Shingrix Completed?: No.    Education has been provided regarding the importance of this vaccine. Patient has been advised to call insurance company to determine out of pocket expense if they have not yet received this vaccine. Advised may also receive vaccine at local pharmacy or Health Dept. Verbalized acceptance and understanding.  Screening Tests Health Maintenance  Topic Date Due   Hepatitis  C Screening  Never done   COLONOSCOPY (Pts 45-54yrs Insurance coverage will need to be confirmed)  Never done   Zoster Vaccines- Shingrix (1 of 2) Never done   DEXA SCAN  Never done   COVID-19 Vaccine (4 - Booster for Moderna series) 03/08/2021   INFLUENZA VACCINE  02/23/2022 (Originally 06/26/2021)   MAMMOGRAM  08/31/2022   TETANUS/TDAP  08/12/2027   HPV VACCINES  Aged Out    Health Maintenance  Health Maintenance Due  Topic Date Due   Hepatitis C Screening  Never done   COLONOSCOPY (Pts 45-68yrs Insurance coverage will need to be confirmed)  Never done   Zoster Vaccines- Shingrix (1 of 2) Never done   DEXA SCAN  Never done   COVID-19 Vaccine (4 - Booster for Moderna series) 03/08/2021    Colorectal cancer screening: Type of screening: Colonoscopy. Completed 2021. Repeat every 10 years  Mammogram status: Ordered Scheduled for 09-04-21. Pt provided with contact info and advised to call to schedule appt.   Bone Density status: Ordered 08-18-21. Pt provided with contact info and advised to call to schedule appt.  Lung Cancer Screening: (Low Dose CT Chest recommended if Age 25-80 years, 30 pack-year  currently smoking OR have quit w/in 15years.) does not qualify.   Lung Cancer Screening Referral: NA  Additional Screening:  Hepatitis C Screening: does qualify; Completed   Vision Screening: Recommended annual ophthalmology exams for early detection of glaucoma and other disorders of the eye. Is the patient up to date with their annual eye exam?  No  Who is the provider or what is the name of the office in which the patient attends annual eye exams? Will Call and make appointment  If pt is not established with a provider, would they like to be referred to a provider to establish care? No .   Dental Screening: Recommended annual dental exams for proper oral hygiene  Community Resource Referral / Chronic Care Management: CRR required this visit?  No   CCM required this visit?  No      Plan:     I have personally reviewed and noted the following in the patient's chart:   Medical and social history Use of alcohol, tobacco or illicit drugs  Current medications and supplements including opioid prescriptions.  Functional ability and status Nutritional status Physical activity Advanced directives List of other physicians Hospitalizations, surgeries, and ER visits in previous 12 months Vitals Screenings to include cognitive, depression, and falls Referrals and appointments  In addition, I have reviewed and discussed with patient certain preventive protocols, quality metrics, and best practice recommendations. A written personalized care plan for preventive services as well as general preventive health recommendations were provided to patient.     Shelda Altes, CMA   08/18/2021   Nurse Notes: This was a telehealth visit. The patient was at home. The provider was in the office and was Ihor Dow, MD.

## 2021-08-19 ENCOUNTER — Ambulatory Visit: Payer: Medicare Other

## 2021-09-04 ENCOUNTER — Other Ambulatory Visit: Payer: Self-pay

## 2021-09-04 ENCOUNTER — Ambulatory Visit (HOSPITAL_COMMUNITY)
Admission: RE | Admit: 2021-09-04 | Discharge: 2021-09-04 | Disposition: A | Discharge status: Home / Self Care | Payer: Medicare Other | Source: Ambulatory Visit | Attending: Nurse Practitioner | Admitting: Nurse Practitioner

## 2021-09-04 DIAGNOSIS — Z1231 Encounter for screening mammogram for malignant neoplasm of breast: Secondary | ICD-10-CM | POA: Insufficient documentation

## 2021-09-21 ENCOUNTER — Other Ambulatory Visit: Payer: Self-pay

## 2021-09-21 ENCOUNTER — Ambulatory Visit (INDEPENDENT_AMBULATORY_CARE_PROVIDER_SITE_OTHER): Payer: Medicare Other | Admitting: Internal Medicine

## 2021-09-21 ENCOUNTER — Encounter: Payer: Self-pay | Admitting: Internal Medicine

## 2021-09-21 VITALS — BP 118/75 | HR 77 | Temp 97.3°F | Ht 66.0 in | Wt 168.6 lb

## 2021-09-21 DIAGNOSIS — K2970 Gastritis, unspecified, without bleeding: Secondary | ICD-10-CM

## 2021-09-21 DIAGNOSIS — B9681 Helicobacter pylori [H. pylori] as the cause of diseases classified elsewhere: Secondary | ICD-10-CM | POA: Diagnosis not present

## 2021-09-21 DIAGNOSIS — A048 Other specified bacterial intestinal infections: Secondary | ICD-10-CM | POA: Diagnosis not present

## 2021-09-21 DIAGNOSIS — K219 Gastro-esophageal reflux disease without esophagitis: Secondary | ICD-10-CM | POA: Diagnosis not present

## 2021-09-21 NOTE — Patient Instructions (Signed)
I am going to order a breath test to be performed at [facility] to ensure that you have cleared your H. pylori infection.  You will have to go off of your Dexilant for 14 days prior to having this done.  You also need to avoid Pepto-Bismol as well.  You can take Tylenol as needed.  You can also take over-the-counter famotidine.  Once you have completed the test, you can restart your Dexilant immediately.  Depending on results, will determine next course of action.  Follow-up with GI in 6 to 8 weeks.  It was good seeing you again today.  Dr. Abbey Chatters  At [facility] we value your feedback. You may receive a survey about your visit today. Please share your experience as we strive to create trusting relationships with our patients to provide genuine, compassionate, quality care.  We appreciate your understanding and patience as we review any laboratory studies, imaging, and other diagnostic tests that are ordered as we care for you. Our office policy is 5 business days for review of these results, and any emergent or urgent results are addressed in a timely manner for your best interest. If you do not hear from our office in 1 week, please contact us.   We also encourage the use of MyChart, which contains your medical information for your review as well. If you are not enrolled in this feature, an access code is on this after visit summary for your convenience. Thank you for allowing [place] to be involved in your care.  It was great to see you today!  I hope you have a great rest of your Fall!    Elon Alas. Abbey Chatters, D.O. Gastroenterology and Hepatology [facility]

## 2021-09-27 NOTE — Progress Notes (Signed)
Referring Provider: Noreene Larsson, NP Primary Care Physician:  Noreene Larsson, NP Primary GI:  Dr. Abbey Chatters  Chief Complaint  Patient presents with   Gastroesophageal Reflux    Still having a lot of burning in chest daily. Taking Dexilant daily. Some days worse than others    HPI:   Ann Reeves is a 65 y.o. female who presents to clinic today for follow-up visit.  Has a history of chronic GERD as well as epigastric pain.  Currently taking Dexilant and still having breakthrough symptoms.  Underwent EGD 02/27/2021 which showed gastritis, biopsies positive for H. pylori gastritis.  She completed course of Prevpac.  Notes improvement in her symptoms initially though they seem to return thereafter.  Continues to take Dexilant daily.  No chronic NSAID use.  Patient reports colonoscopy 2021 at [facility] [place] [facility] though for some reason there are no records of this.  She is adamant that she had this performed.  Past Medical History:  Diagnosis Date   Hypertension    Hyperthyroidism- had goiter removed in 1998     Past Surgical History:  Procedure Laterality Date   BIOPSY  02/27/2021   Procedure: BIOPSY;  Surgeon: [name], DO;  [facility];  Service: Endoscopy;;  gastric   ESOPHAGOGASTRODUODENOSCOPY (EGD) WITH PROPOFOL N/A 02/27/2021   Procedure: ESOPHAGOGASTRODUODENOSCOPY (EGD) WITH PROPOFOL;  Surgeon: [name], DO;  [facility];  Service: Endoscopy;  Laterality: N/A;  PM   THYROID SURGERY      Current Outpatient Medications  Medication Sig Dispense Refill   albuterol (VENTOLIN HFA) 108 (90 Base) MCG/ACT inhaler Inhale 2 puffs into the lungs every 6 (six) hours as needed for wheezing or shortness of breath. 8 g 0   cholecalciferol (VITAMIN D) 25 MCG (1000 UNIT) tablet Take 1,000 Units by mouth daily.     dexlansoprazole (DEXILANT) 60 MG capsule Take 1 capsule (60 mg total) by mouth daily. 90 capsule 3   levothyroxine (SYNTHROID) 175 MCG tablet Take 175  mcg by mouth daily before breakfast.     lidocaine (XYLOCAINE) 2 % solution Use as directed 15 mLs in the mouth or throat 2 (two) times daily as needed (chest/esophagus pain. May cause mouth/tongue numbness). 100 mL 1   meloxicam (MOBIC) 7.5 MG tablet Take 7.5 mg by mouth daily.     tiZANidine (ZANAFLEX) 4 MG tablet Take 1 tablet (4 mg total) by mouth every 6 (six) hours as needed for muscle spasms. 30 tablet 0   No current facility-administered medications for this visit.    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[place]  Tobacco Use   Smoking status: Former    Packs/day: 0.50    Years: 10.00    Pack years: 5.00    Types: Cigarettes   Smokeless tobacco: Never  Vaping Use   Vaping Use: Never used  Substance and Sexual Activity   Alcohol use: Yes    Comment: occas- 2-3 drinks per week   Drug use: Never   Sexual activity: Yes  Birth control/protection: Post-menopausal  Other Topics Concern   Not on file  Social History Narrative   Son lives in [place]; family lives in [place], moved to [place] at age 8   Social Determinants of Health   Financial Resource Strain: Low Risk    Difficulty of Paying Living Expenses: Not hard at all  Food Insecurity: No Food Insecurity   Worried About Charity fundraiser in the Last Year: Never true   Arboriculturist in the Last Year: Never true  Transportation Needs: No Transportation Needs   Lack of Transportation (Medical): No   Lack of Transportation (Non-Medical): No  Physical Activity: Inactive   Days of Exercise per Week: 0 days   Minutes of Exercise per Session: 0 min  Stress: No  Stress Concern Present   Feeling of Stress : Not at all  Social Connections: Moderately Isolated   Frequency of Communication with Friends and Family: More than three times a week   Frequency of Social Gatherings with Friends and Family: More than three times a week   Attends Religious Services: Never   Marine scientist or Organizations: No   Attends Archivist Meetings: Never   Marital Status: Married    Subjective: Review of Systems  Constitutional:  Negative for chills and fever.  HENT:  Negative for congestion and hearing loss.   Eyes:  Negative for blurred vision and double vision.  Respiratory:  Negative for cough and shortness of breath.   Cardiovascular:  Negative for chest pain and palpitations.  Gastrointestinal:  Positive for abdominal pain and heartburn. Negative for blood in stool, constipation, diarrhea, melena and vomiting.  Genitourinary:  Negative for dysuria and urgency.  Musculoskeletal:  Negative for joint pain and myalgias.  Skin:  Negative for itching and rash.  Neurological:  Negative for dizziness and headaches.  Psychiatric/Behavioral:  Negative for depression. The patient is not nervous/anxious.     Objective: BP 118/75   Pulse 77   Temp (!) 97.3 F (36.3 C)   Ht 5\' 6"  (1.676 m)   Wt 168 lb 9.6 oz (76.5 kg)   BMI 27.21 kg/m  Physical Exam Constitutional:      Appearance: Normal appearance.  HENT:     Head: Normocephalic and atraumatic.  Eyes:     Extraocular Movements: Extraocular movements intact.     Conjunctiva/sclera: Conjunctivae normal.  Cardiovascular:     Rate and Rhythm: Normal rate and regular rhythm.  Pulmonary:     Effort: Pulmonary effort is normal.     Breath sounds: Normal breath sounds.  Abdominal:     General: Bowel sounds are normal.     Palpations: Abdomen is soft.  Musculoskeletal:        General: No swelling. Normal range of motion.     Cervical back: Normal range of motion and neck supple.  Skin:     General: Skin is warm and dry.     Coloration: Skin is not jaundiced.  Neurological:     General: No focal deficit present.     Mental Status: She is alert and oriented to person, place, and time.  Psychiatric:        Mood and Affect: Mood normal.        Behavior: Behavior normal.     Assessment: *H. pylori gastritis *GERD *Abdominal pain  Plan: Patient has completed treatment for her H. pylori.  We will check eradication testing.  She will need to hold her [place] for 14  days prior to testing.  She understands.  After testing, she can go immediately back on this medication.  Still having significant abdominal pain and GERD symptoms.  If her H. pylori has been eradicated, we will consider further work up for biliary colic.  Further recommendations to follow.  Follow-up in 6 to 8 weeks  09/27/2021 12:01 PM   Disclaimer: This note was dictated with voice recognition software. Similar sounding words can inadvertently be transcribed and may not be corrected upon review.

## 2021-10-10 ENCOUNTER — Ambulatory Visit: Payer: Medicare Other | Admitting: Nurse Practitioner

## 2021-10-23 ENCOUNTER — Other Ambulatory Visit: Payer: Self-pay

## 2021-10-23 ENCOUNTER — Ambulatory Visit (INDEPENDENT_AMBULATORY_CARE_PROVIDER_SITE_OTHER): Payer: Medicare Other | Admitting: Internal Medicine

## 2021-10-23 ENCOUNTER — Encounter: Payer: Self-pay | Admitting: Internal Medicine

## 2021-10-23 VITALS — BP 118/68 | HR 84 | Resp 18 | Ht 66.0 in | Wt 171.0 lb

## 2021-10-23 DIAGNOSIS — K219 Gastro-esophageal reflux disease without esophagitis: Secondary | ICD-10-CM | POA: Diagnosis not present

## 2021-10-23 DIAGNOSIS — E039 Hypothyroidism, unspecified: Secondary | ICD-10-CM

## 2021-10-23 DIAGNOSIS — Z780 Asymptomatic menopausal state: Secondary | ICD-10-CM

## 2021-10-23 DIAGNOSIS — M7732 Calcaneal spur, left foot: Secondary | ICD-10-CM

## 2021-10-23 NOTE — Progress Notes (Signed)
Established Patient Office Visit  Subjective:  Patient ID: Ann Reeves, female    DOB: 1956/09/04  Age: 65 y.o. MRN: [id number]  CC:  Chief Complaint  Patient presents with   Follow-up    Follow up pt is having issues from heel spurs these popped up years ago had cortisone shots and have been fine until now  also pt was having heart burn does have follow up with gastro     HPI Ann Reeves is a 65 y.o. female with past medical history of GERD, hypothyroidism and sciatica who presents for f/u of her chronic medical conditions.  Hypothyroidism: She takes levothyroxine 175 mcg daily.  She denies any recent change in weight or appetite.  No recent TSH or free T4 testing available to review in the chart.  GERD and recent H. pylori infection: She was recently treated for H. pylori.  She is on Dexilant currently for GERD.  She has had Dexilant for H pylori breath test for confirmation of eradication.  She complains of left foot pain.  Of note, she has history of left heel spur and has had cortisone injections in the past.  She denies any recent injury or fall.  Past Medical History:  Diagnosis Date   Hypertension    Hyperthyroidism- had goiter removed in 1998     Past Surgical History:  Procedure Laterality Date   BIOPSY  02/27/2021   Procedure: BIOPSY;  Surgeon: [name], DO;  [facility];  Service: Endoscopy;;  gastric   ESOPHAGOGASTRODUODENOSCOPY (EGD) WITH PROPOFOL N/A 02/27/2021   Procedure: ESOPHAGOGASTRODUODENOSCOPY (EGD) WITH PROPOFOL;  Surgeon: [name], DO;  Location: [facility];  Service: Endoscopy;  Laterality: N/A;  PM   THYROID SURGERY      Family History  Problem Relation Age of Onset   Cancer Maternal Aunt     Social History   Socioeconomic History   Marital status: Married    Spouse name: Not on file   Number of children: 1   Years of education: Not on file   Highest education level: Not on file  Occupational History    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 [place]  Tobacco Use   Smoking status: Former    Packs/day: 0.50    Years: 10.00    Pack years: 5.00    Types: Cigarettes   Smokeless tobacco: Never  Vaping Use   Vaping Use: Never used  Substance and Sexual Activity   Alcohol use: Yes    Comment: occas- 2-3 drinks per week   Drug use: Never   Sexual activity: Yes    Birth control/protection: Post-menopausal  Other Topics Concern   Not on file  Social History Narrative   Son lives in [place]; family lives in [place], moved to [place] at age 34   Social Determinants of Health   Financial Resource Strain: Low Risk    Difficulty of Paying Living Expenses: Not hard at all  Food Insecurity: No Food Insecurity   Worried About Charity fundraiser in the Last Year: Never true   Arboriculturist in the Last Year: Never true  Transportation Needs: No Transportation Needs   Lack of Transportation (Medical): No   Lack of Transportation (Non-Medical): No  Physical Activity: Inactive   Days of Exercise per Week: 0 days   Minutes of Exercise per Session: 0 min  Stress: No Stress Concern Present   Feeling of Stress : Not at all  Social  Connections: Moderately Isolated   Frequency of Communication with Friends and Family: More than three times a week   Frequency of Social Gatherings with Friends and Family: More than three times a week   Attends Religious Services: Never   Marine scientist or Organizations: No   Attends Music therapist: Never   Marital Status: Married  Human resources officer Violence: Not At Risk   Fear of Current or Ex-Partner: No   Emotionally Abused: No   Physically Abused: No   Sexually Abused: No    Outpatient Medications Prior to Visit  Medication Sig Dispense Refill   albuterol (VENTOLIN HFA) 108 (90 Base) MCG/ACT inhaler Inhale 2 puffs into the lungs every 6 (six) hours as needed for wheezing or shortness of breath. 8 g 0   cholecalciferol (VITAMIN  D) 25 MCG (1000 UNIT) tablet Take 1,000 Units by mouth daily.     dexlansoprazole (DEXILANT) 60 MG capsule Take 1 capsule (60 mg total) by mouth daily. 90 capsule 3   levothyroxine (SYNTHROID) 175 MCG tablet Take 175 mcg by mouth daily before breakfast.     lidocaine (XYLOCAINE) 2 % solution Use as directed 15 mLs in the mouth or throat 2 (two) times daily as needed (chest/esophagus pain. May cause mouth/tongue numbness). 100 mL 1   meloxicam (MOBIC) 7.5 MG tablet Take 7.5 mg by mouth daily.     tiZANidine (ZANAFLEX) 4 MG tablet Take 1 tablet (4 mg total) by mouth every 6 (six) hours as needed for muscle spasms. 30 tablet 0   No facility-administered medications prior to visit.    No Known Allergies  ROS Review of Systems  Constitutional:  Negative for chills and fever.  HENT:  Negative for congestion, sinus pressure, sinus pain and sore throat.   Eyes:  Negative for pain and discharge.  Respiratory:  Negative for cough and shortness of breath.   Cardiovascular:  Negative for chest pain and palpitations.  Gastrointestinal:  Negative for abdominal pain, constipation, diarrhea, nausea and vomiting.  Endocrine: Negative for polydipsia and polyuria.  Genitourinary:  Negative for dysuria and hematuria.  Musculoskeletal:  Negative for neck pain and neck stiffness.       L foot pain  Skin:  Negative for rash.  Neurological:  Negative for dizziness and weakness.  Psychiatric/Behavioral:  Negative for agitation and behavioral problems.      Objective:    Physical Exam Vitals reviewed.  Constitutional:      General: She is not in acute distress.    Appearance: She is not diaphoretic.  HENT:     Head: Normocephalic and atraumatic.     Nose: Nose normal. No congestion.     Mouth/Throat:     Mouth: Mucous membranes are moist.     Pharynx: No posterior oropharyngeal erythema.  Eyes:     General: No scleral icterus.    Extraocular Movements: Extraocular movements intact.   Cardiovascular:     Rate and Rhythm: Normal rate and regular rhythm.     Pulses: Normal pulses.     Heart sounds: Normal heart sounds. No murmur heard. Pulmonary:     Breath sounds: Normal breath sounds. No wheezing or rales.  Musculoskeletal:     Cervical back: Neck supple. No tenderness.     Right lower leg: No edema.     Left lower leg: No edema.  Skin:    General: Skin is warm.     Findings: No rash.  Neurological:     General: No  focal deficit present.     Mental Status: She is alert and oriented to person, place, and time.  Psychiatric:        Mood and Affect: Mood normal.        Behavior: Behavior normal.    BP 118/68 (BP Location: Left Arm, Patient Position: Sitting, Cuff Size: Normal)   Pulse 84   Resp 18   Ht 5' 6" (1.676 m)   Wt 171 lb 0.6 oz (77.6 kg)   SpO2 96%   BMI 27.61 kg/m  Wt Readings from Last 3 Encounters:  10/23/21 171 lb 0.6 oz (77.6 kg)  09/21/21 168 lb 9.6 oz (76.5 kg)  07/26/21 170 lb (77.1 kg)    No results found for: TSH No results found for: WBC, HGB, HCT, MCV, PLT No results found for: NA, K, CHLORIDE, CO2, GLUCOSE, BUN, CREATININE, BILITOT, ALKPHOS, AST, ALT, PROT, ALBUMIN, CALCIUM, ANIONGAP, EGFR, GFR No results found for: CHOL No results found for: HDL No results found for: LDLCALC No results found for: TRIG No results found for: CHOLHDL No results found for: HGBA1C    Assessment & Plan:   Problem List Items Addressed This Visit       Digestive   GERD (gastroesophageal reflux disease)    On Dexilant Recently completed treatment for H. Pylori, needs H. pylori breath test for confirmation of eradication Followed by Dr. Abbey Chatters         Endocrine   Acquired hypothyroidism - Primary    Needs TSH and free T4 testing On levothyroxine 175 mcg QD        Musculoskeletal and Integument   Heel spur, left    Has had cortisone injections in the past Referred to podiatry      Relevant Orders   Ambulatory referral to Podiatry    Other Visit Diagnoses     Post-menopausal       Relevant Orders   DG Bone Density       No orders of the defined types were placed in this encounter.   Follow-up: Return in about 6 months (around 04/22/2022) for Hypothyroidism.    Lindell Spar, MD

## 2021-10-23 NOTE — Patient Instructions (Signed)
Please continue to take medications as prescribed.  You are being referred to Podiatrist.  Please get fasting blood tests tomorrow.

## 2021-10-26 ENCOUNTER — Other Ambulatory Visit (HOSPITAL_COMMUNITY): Payer: Medicare Other

## 2021-10-26 DIAGNOSIS — M7732 Calcaneal spur, left foot: Secondary | ICD-10-CM | POA: Insufficient documentation

## 2021-10-26 NOTE — Assessment & Plan Note (Signed)
On Dexilant Recently completed treatment for H. Pylori, needs H. pylori breath test for confirmation of eradication Followed by Dr. Abbey Chatters

## 2021-10-26 NOTE — Assessment & Plan Note (Signed)
Has had cortisone injections in the past Referred to podiatry

## 2021-10-26 NOTE — Assessment & Plan Note (Signed)
Needs TSH and free T4 testing On levothyroxine 175 mcg QD

## 2021-10-27 ENCOUNTER — Other Ambulatory Visit: Payer: Self-pay

## 2021-10-27 ENCOUNTER — Telehealth: Payer: Self-pay | Admitting: Internal Medicine

## 2021-10-27 DIAGNOSIS — K2970 Gastritis, unspecified, without bleeding: Secondary | ICD-10-CM

## 2021-10-27 DIAGNOSIS — B9681 Helicobacter pylori [H. pylori] as the cause of diseases classified elsewhere: Secondary | ICD-10-CM

## 2021-10-27 NOTE — Telephone Encounter (Signed)
Patient states she is supposed to have a breath test and [facility] does not have the order

## 2021-10-27 NOTE — Telephone Encounter (Signed)
Returned the pt's call and LMOVM that the req form was in the system for the H Pylori test and she was also given a copy when she came into the office

## 2021-10-28 LAB — CBC WITH DIFFERENTIAL/PLATELET
Basophils Absolute: 0.1 10*3/uL (ref 0.0–0.2)
Basos: 1 %
EOS (ABSOLUTE): 0.4 10*3/uL (ref 0.0–0.4)
Eos: 6 %
Hematocrit: 43.6 % (ref 34.0–46.6)
Hemoglobin: 14.4 g/dL (ref 11.1–15.9)
Immature Grans (Abs): 0 10*3/uL (ref 0.0–0.1)
Immature Granulocytes: 0 %
Lymphocytes Absolute: 3.4 10*3/uL — ABNORMAL HIGH (ref 0.7–3.1)
Lymphs: 48 %
MCH: 29.2 pg (ref 26.6–33.0)
MCHC: 33 g/dL (ref 31.5–35.7)
MCV: 88 fL (ref 79–97)
Monocytes Absolute: 0.5 10*3/uL (ref 0.1–0.9)
Monocytes: 7 %
Neutrophils Absolute: 2.8 10*3/uL (ref 1.4–7.0)
Neutrophils: 38 %
Platelets: 299 10*3/uL (ref 150–450)
RBC: 4.93 x10E6/uL (ref 3.77–5.28)
RDW: 13.1 % (ref 11.7–15.4)
WBC: 7.2 10*3/uL (ref 3.4–10.8)

## 2021-10-28 LAB — CMP14+EGFR
ALT: 12 IU/L (ref 0–32)
AST: 15 IU/L (ref 0–40)
Albumin/Globulin Ratio: 1.4 (ref 1.2–2.2)
Albumin: 4.2 g/dL (ref 3.8–4.8)
Alkaline Phosphatase: 111 IU/L (ref 44–121)
BUN/Creatinine Ratio: 13 (ref 12–28)
BUN: 15 mg/dL (ref 8–27)
Bilirubin Total: 0.4 mg/dL (ref 0.0–1.2)
CO2: 25 mmol/L (ref 20–29)
Calcium: 8.6 mg/dL — ABNORMAL LOW (ref 8.7–10.3)
Chloride: 102 mmol/L (ref 96–106)
Creatinine, Ser: 1.16 mg/dL — ABNORMAL HIGH (ref 0.57–1.00)
Globulin, Total: 3.1 g/dL (ref 1.5–4.5)
Glucose: 90 mg/dL (ref 70–99)
Potassium: 4.7 mmol/L (ref 3.5–5.2)
Sodium: 142 mmol/L (ref 134–144)
Total Protein: 7.3 g/dL (ref 6.0–8.5)
eGFR: 52 mL/min/{1.73_m2} — ABNORMAL LOW (ref >59)

## 2021-10-28 LAB — HEPATITIS C ANTIBODY: Hep C Virus Ab: <0.1 s/co ratio (ref 0.0–0.9)

## 2021-10-28 LAB — LIPID PANEL WITH LDL/HDL RATIO
Cholesterol, Total: 265 mg/dL — ABNORMAL HIGH (ref 100–199)
HDL: 53 mg/dL (ref >39)
LDL Chol Calc (NIH): 188 mg/dL — ABNORMAL HIGH (ref 0–99)
LDL/HDL Ratio: 3.5 ratio — ABNORMAL HIGH (ref 0.0–3.2)
Triglycerides: 133 mg/dL (ref 0–149)
VLDL Cholesterol Cal: 24 mg/dL (ref 5–40)

## 2021-10-28 LAB — TSH+FREE T4
Free T4: 0.96 ng/dL (ref 0.82–1.77)
TSH: 19.1 u[IU]/mL — ABNORMAL HIGH (ref 0.450–4.500)

## 2021-10-31 LAB — H. PYLORI BREATH TEST: H. pylori Breath Test: NOT DETECTED

## 2021-11-01 ENCOUNTER — Other Ambulatory Visit: Payer: Self-pay | Admitting: Internal Medicine

## 2021-11-01 DIAGNOSIS — E039 Hypothyroidism, unspecified: Secondary | ICD-10-CM

## 2021-11-01 MED ORDER — LEVOTHYROXINE SODIUM 200 MCG PO TABS: 200.0000 ug | ORAL_TABLET | Freq: Every day | ORAL | 3 refills | Status: DC

## 2021-11-01 NOTE — Progress Notes (Signed)
Mammogram negative. Repeat in 1 year.

## 2021-11-01 NOTE — Progress Notes (Signed)
Cholesterol and TSH are elevated. You see her again in May.

## 2021-11-02 ENCOUNTER — Ambulatory Visit: Payer: Medicare Other | Admitting: Internal Medicine

## 2021-11-06 ENCOUNTER — Ambulatory Visit: Payer: Medicare Other | Admitting: Podiatry

## 2021-12-07 ENCOUNTER — Ambulatory Visit (INDEPENDENT_AMBULATORY_CARE_PROVIDER_SITE_OTHER): Payer: Medicare Other | Admitting: Internal Medicine

## 2021-12-07 ENCOUNTER — Encounter: Payer: Self-pay | Admitting: *Deleted

## 2021-12-07 ENCOUNTER — Encounter: Payer: Self-pay | Admitting: Internal Medicine

## 2021-12-07 ENCOUNTER — Other Ambulatory Visit: Payer: Self-pay

## 2021-12-07 VITALS — BP 113/67 | HR 86 | Temp 97.5°F | Ht 66.0 in | Wt 170.2 lb

## 2021-12-07 DIAGNOSIS — R109 Unspecified abdominal pain: Secondary | ICD-10-CM

## 2021-12-07 DIAGNOSIS — K5900 Constipation, unspecified: Secondary | ICD-10-CM

## 2021-12-07 DIAGNOSIS — K219 Gastro-esophageal reflux disease without esophagitis: Secondary | ICD-10-CM

## 2021-12-07 NOTE — Progress Notes (Signed)
Referring Provider: Noreene Larsson, NP Primary Care Physician:  Lindell Spar, MD Primary GI:  Dr. Abbey Chatters  Chief Complaint  Patient presents with   Gastroesophageal Reflux    HPI:   Ann Reeves is a 66 y.o. female who presents to clinic today for follow-up visit.  Has a history of chronic GERD as well as epigastric pain.  Currently taking Dexilant and still having breakthrough symptoms.  Underwent EGD 02/27/2021 which showed gastritis, biopsies positive for H. pylori gastritis.  She completed course of Prevpac.  Notes improvement in her symptoms initially though they seem to return thereafter.  Continues to take Dexilant daily.  No chronic NSAID use.  Patient reports colonoscopy 2021 at [facility] though for some reason there are no records of this.  She is adamant that she had this performed.  H. pylori breath test for eradication 10/30/2021 negative.  Continues to have epigastric pain.  States it radiates to her back/shoulders.  Chronic.  Slightly improved compared to prior.  States it 5-6 out of 10 in severity.  Also notes chronic constipation.  States she will have a bowel movement every few days.  Notes hard stools with straining.  Has taken Colace over-the-counter which seems to help some.  Takes this about once a week.  Past Medical History:  Diagnosis Date   Hypertension    Hyperthyroidism- had goiter removed in 1998     Past Surgical History:  Procedure Laterality Date   BIOPSY  02/27/2021   Procedure: BIOPSY;  Surgeon: [name], DO;  [facility];  Service: Endoscopy;;  gastric   ESOPHAGOGASTRODUODENOSCOPY (EGD) WITH PROPOFOL N/A 02/27/2021   Procedure: ESOPHAGOGASTRODUODENOSCOPY (EGD) WITH PROPOFOL;  Surgeon: [name], DO;  [facility];  Service: Endoscopy;  Laterality: N/A;  PM   THYROID SURGERY      Current Outpatient Medications  Medication Sig Dispense Refill   albuterol (VENTOLIN HFA) 108 (90 Base) MCG/ACT inhaler Inhale  2 puffs into the lungs every 6 (six) hours as needed for wheezing or shortness of breath. 8 g 0   cholecalciferol (VITAMIN D) 25 MCG (1000 UNIT) tablet Take 1,000 Units by mouth daily.     dexlansoprazole (DEXILANT) 60 MG capsule Take 1 capsule (60 mg total) by mouth daily. 90 capsule 3   levothyroxine (SYNTHROID) 200 MCG tablet Take 1 tablet (200 mcg total) by mouth daily before breakfast. 30 tablet 3   meloxicam (MOBIC) 7.5 MG tablet Take 7.5 mg by mouth daily.     tiZANidine (ZANAFLEX) 4 MG tablet Take 1 tablet (4 mg total) by mouth every 6 (six) hours as needed for muscle spasms. 30 tablet 0   lidocaine (XYLOCAINE) 2 % solution Use as directed 15 mLs in the mouth or throat 2 (two) times daily as needed (chest/esophagus pain. May cause mouth/tongue numbness). (Patient not taking: Reported on 12/07/2021) 100 mL 1   No current facility-administered medications for this visit.    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[place]  Tobacco Use   Smoking  status: Former    Packs/day: 0.50    Years: 10.00    Pack years: 5.00    Types: Cigarettes   Smokeless tobacco: Never  Vaping Use   Vaping Use: Never used  Substance and Sexual Activity   Alcohol use: Yes    Comment: occ   Drug use: Never   Sexual activity: Yes    Birth control/protection: Post-menopausal  Other Topics Concern   Not on file  Social History Narrative   Son lives in [place]; family lives in [place], moved to [place] at age 65   Social Determinants of Health   Financial Resource Strain: Low Risk    Difficulty of Paying Living Expenses: Not hard at all  Food  Insecurity: No Food Insecurity   Worried About Charity fundraiser in the Last Year: Never true   Arboriculturist in the Last Year: Never true  Transportation Needs: No Transportation Needs   Lack of Transportation (Medical): No   Lack of Transportation (Non-Medical): No  Physical Activity: Inactive   Days of Exercise per Week: 0 days   Minutes of Exercise per Session: 0 min  Stress: No Stress Concern Present   Feeling of Stress : Not at all  Social Connections: Moderately Isolated   Frequency of Communication with Friends and Family: More than three times a week   Frequency of Social Gatherings with Friends and Family: More than three times a week   Attends Religious Services: Never   Marine scientist or Organizations: No   Attends Archivist Meetings: Never   Marital Status: Married    Subjective: Review of Systems  Constitutional:  Negative for chills and fever.  HENT:  Negative for congestion and hearing loss.   Eyes:  Negative for blurred vision and double vision.  Respiratory:  Negative for cough and shortness of breath.   Cardiovascular:  Negative for chest pain and palpitations.  Gastrointestinal:  Positive for abdominal pain and heartburn. Negative for blood in stool, constipation, diarrhea, melena and vomiting.  Genitourinary:  Negative for dysuria and urgency.  Musculoskeletal:  Negative for joint pain and myalgias.  Skin:  Negative for itching and rash.  Neurological:  Negative for dizziness and headaches.  Psychiatric/Behavioral:  Negative for depression. The patient is not nervous/anxious.     Objective: BP 113/67    Pulse 86    Temp (!) 97.5 F (36.4 C) (Temporal)    Ht 5\' 6"  (1.676 m)    Wt 170 lb 3.2 oz (77.2 kg)    BMI 27.47 kg/m  Physical Exam Constitutional:      Appearance: Normal appearance.  HENT:     Head: Normocephalic and atraumatic.  Eyes:     Extraocular Movements: Extraocular movements intact.     Conjunctiva/sclera:  Conjunctivae normal.  Cardiovascular:     Rate and Rhythm: Normal rate and regular rhythm.  Pulmonary:     Effort: Pulmonary effort is normal.     Breath sounds: Normal breath sounds.  Abdominal:     General: Bowel sounds are normal.     Palpations: Abdomen is soft.  Musculoskeletal:        General: No swelling. Normal range of motion.     Cervical back: Normal range of motion and neck supple.  Skin:    General: Skin is warm and dry.     Coloration: Skin is not jaundiced.  Neurological:     General: No focal deficit present.     Mental Status:  She is alert and oriented to person, place, and time.  Psychiatric:        Mood and Affect: Mood normal.        Behavior: Behavior normal.     Assessment: *H. pylori gastritis *GERD *Abdominal pain *Constipation  Plan: Patient has completed treatment for her H. pylori with negative eradication testing 10/30/2021 via breath test.  GERD improved on Dexilant.  We will continue.  Still having significant abdominal pain.  Will check right upper quadrant ultrasound to further evaluate.  If negative consider HIDA scan.  For her constipation, I recommend that she start taking MiraLAX 1 capful daily.  If this is not adequate she can increase to 2 capfuls daily.  If this is still not adequate she can add on once daily Dulcolax.  Follow-up in 3 months.  12/07/2021 10:02 AM   Disclaimer: This note was dictated with voice recognition software. Similar sounding words can inadvertently be transcribed and may not be corrected upon review.

## 2021-12-07 NOTE — Patient Instructions (Signed)
For your constipation, I want you to start taking over the counter MiraLAX 1 capful daily.  If this does not adequately control your constipation, I would increase to 2 capfuls daily.  If this does not improve your symptoms then I want you to add on once daily Dulcolax (bisacodyl) to the MiraLAX.   I also recommend increasing fiber in your diet or adding OTC Benefiber/Metamucil. Be sure to drink at least 4 to 6 glasses of water daily.   For your chronic abdominal pain, we will order ultrasound to evaluate your gallbladder.  If this is normal, then we will likely proceed with HIDA scan to further evaluate the function of your gallbladder.  Otherwise follow-up with GI in 3 to 4 months.  It was nice seeing you again today.  Dr. Abbey Chatters  At [facility] we value your feedback. You may receive a survey about your visit today. Please share your experience as we strive to create trusting relationships with our patients to provide genuine, compassionate, quality care.  We appreciate your understanding and patience as we review any laboratory studies, imaging, and other diagnostic tests that are ordered as we care for you. Our office policy is 5 business days for review of these results, and any emergent or urgent results are addressed in a timely manner for your best interest. If you do not hear from our office in 1 week, please contact us.   We also encourage the use of MyChart, which contains your medical information for your review as well. If you are not enrolled in this feature, an access code is on this after visit summary for your convenience. Thank you for allowing [place] to be involved in your care.  It was great to see you today!  I hope you have a great rest of your Winter!    Elon Alas. Abbey Chatters, D.O. Gastroenterology and Hepatology [facility]

## 2021-12-14 ENCOUNTER — Other Ambulatory Visit: Payer: Self-pay

## 2021-12-14 ENCOUNTER — Ambulatory Visit (HOSPITAL_COMMUNITY)
Admission: RE | Admit: 2021-12-14 | Discharge: 2021-12-14 | Disposition: A | Discharge status: Home / Self Care | Payer: Medicare Other | Source: Ambulatory Visit | Attending: Internal Medicine | Admitting: Internal Medicine

## 2021-12-14 DIAGNOSIS — R109 Unspecified abdominal pain: Secondary | ICD-10-CM | POA: Insufficient documentation

## 2021-12-25 ENCOUNTER — Other Ambulatory Visit: Payer: Self-pay

## 2021-12-25 DIAGNOSIS — R1011 Right upper quadrant pain: Secondary | ICD-10-CM

## 2021-12-25 NOTE — Progress Notes (Signed)
No PA needed for HIDA per [facility] website: Member is Medicare primary and does not require a notification at this time.

## 2022-01-02 ENCOUNTER — Other Ambulatory Visit: Payer: Self-pay

## 2022-01-02 ENCOUNTER — Encounter (HOSPITAL_COMMUNITY): Payer: Self-pay

## 2022-01-02 ENCOUNTER — Ambulatory Visit (HOSPITAL_COMMUNITY)
Admission: RE | Admit: 2022-01-02 | Discharge: 2022-01-02 | Disposition: A | Discharge status: Home / Self Care | Payer: Medicare Other | Source: Ambulatory Visit | Attending: Internal Medicine | Admitting: Internal Medicine

## 2022-01-02 DIAGNOSIS — R1011 Right upper quadrant pain: Secondary | ICD-10-CM | POA: Diagnosis present

## 2022-01-02 MED ORDER — TECHNETIUM TC 99M MEBROFENIN IV KIT
5.0000 | PACK | Freq: Once | INTRAVENOUS | Status: AC | PRN
  Administered 2022-01-02: 5.2 via INTRAVENOUS

## 2022-02-01 ENCOUNTER — Ambulatory Visit (INDEPENDENT_AMBULATORY_CARE_PROVIDER_SITE_OTHER): Payer: Medicare Other | Admitting: Internal Medicine

## 2022-02-01 ENCOUNTER — Other Ambulatory Visit: Payer: Self-pay

## 2022-02-01 ENCOUNTER — Encounter: Payer: Self-pay | Admitting: Internal Medicine

## 2022-02-01 VITALS — BP 122/82 | HR 92 | Resp 18 | Ht 66.0 in | Wt 166.0 lb

## 2022-02-01 DIAGNOSIS — E039 Hypothyroidism, unspecified: Secondary | ICD-10-CM

## 2022-02-01 DIAGNOSIS — M1991 Primary osteoarthritis, unspecified site: Secondary | ICD-10-CM | POA: Diagnosis not present

## 2022-02-01 DIAGNOSIS — R1013 Epigastric pain: Secondary | ICD-10-CM | POA: Diagnosis not present

## 2022-02-01 DIAGNOSIS — K219 Gastro-esophageal reflux disease without esophagitis: Secondary | ICD-10-CM | POA: Diagnosis not present

## 2022-02-01 DIAGNOSIS — Z23 Encounter for immunization: Secondary | ICD-10-CM

## 2022-02-01 MED ORDER — DEXLANSOPRAZOLE 60 MG PO CPDR: 60.0000 mg | DELAYED_RELEASE_CAPSULE | Freq: Every day | ORAL | 3 refills | Status: DC

## 2022-02-01 NOTE — Assessment & Plan Note (Signed)
Lab Results  ?Component Value Date  ? TSH 19.100 (H) 10/27/2021  ? ? ?Needs TSH and free T4 testing ?Her dose of levothyroxine was increased to 200 mcg daily in the last visit ?

## 2022-02-01 NOTE — Assessment & Plan Note (Signed)
On Dexilant ?Recently completed treatment for H. Pylori ?Needs to avoid NSAIDs, advised to take Tylenol or Voltaren gel for arthritis ?Followed by Dr. Abbey Chatters ? ?

## 2022-02-01 NOTE — Addendum Note (Signed)
Addended byIhor Dow on: 02/01/2022 11:22 AM ? ? Modules accepted: Level of Service ? ?

## 2022-02-01 NOTE — Progress Notes (Addendum)
Established Patient Office Visit  Subjective:  Patient ID: Ann Reeves, female    DOB: 02-18-1956  Age: 66 y.o. MRN: [id number]  CC:  Chief Complaint  Patient presents with   Follow-up    3 month follow up TSH testing also pt is still having heartburn 6 months or more no better than before     HPI Ann Reeves is a 66 y.o. female with past medical history of GERD, hypothyroidism and sciatica who presents for f/u of her chronic medical conditions.  GERD: She still complains of heartburn despite taking Dexilant.  She has been treated for H. pylori as well and her H. pylori breath test was negative later.  Of note, she has been taking Mobic for arthritis and agrees to stop taking it now.  She denies any dysphagia or odynophagia currently.  Hypothyroidism: Her TSH was elevated in the last visit and her dose of levothyroxine was increased to 200 mcg.  She denies any recent change in weight or appetite.  Denies any tremors or palpitations currently.  She received PCV20 in the office today.  Past Medical History:  Diagnosis Date   Hypertension    Hyperthyroidism- had goiter removed in 1998     Past Surgical History:  Procedure Laterality Date   BIOPSY  02/27/2021   Procedure: BIOPSY;  Surgeon: [name], DO;  [facility];  Service: Endoscopy;;  gastric   ESOPHAGOGASTRODUODENOSCOPY (EGD) WITH PROPOFOL N/A 02/27/2021   Procedure: ESOPHAGOGASTRODUODENOSCOPY (EGD) WITH PROPOFOL;  Surgeon: [name], DO;  [facility]: [facility];  Service: Endoscopy;  Laterality: N/A;  PM   THYROID SURGERY      Family History  Problem Relation Age of Onset   Cancer Maternal Aunt     Social History   Socioeconomic History   Marital status: Married    Spouse name: Not on file   Number of children: 1   Years of education: Not on file   Highest education level: Not on file  Occupational History   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 [place]  Tobacco Use    Smoking status: Former    Packs/day: 0.50    Years: 10.00    Pack years: 5.00    Types: Cigarettes   Smokeless tobacco: Never  Vaping Use   Vaping Use: Never used  Substance and Sexual Activity   Alcohol use: Yes    Comment: occ   Drug use: Never   Sexual activity: Yes    Birth control/protection: Post-menopausal  Other Topics Concern   Not on file  Social History Narrative   Son lives in [place]; family lives in [place], moved to [place] at age 31   Social Determinants of Health   Financial Resource Strain: Low Risk    Difficulty of Paying Living Expenses: Not hard at all  Food Insecurity: No Food Insecurity   Worried About Charity fundraiser in the Last Year: Never true   Arboriculturist in the Last Year: Never true  Transportation Needs: No Transportation Needs   Lack of Transportation (Medical): No   Lack of Transportation (Non-Medical): No  Physical Activity: Inactive   Days of Exercise per Week: 0 days   Minutes of Exercise per Session: 0 min  Stress: No Stress Concern Present   Feeling of Stress : Not at all  Social Connections: Moderately Isolated   Frequency of Communication with Friends and Family: More than three times a week   Frequency of  Social Gatherings with Friends and Family: More than three times a week   Attends Religious Services: Never   Marine scientist or Organizations: No   Attends Music therapist: Never   Marital Status: Married  Human resources officer Violence: Not At Risk   Fear of Current or Ex-Partner: No   Emotionally Abused: No   Physically Abused: No   Sexually Abused: No    Outpatient Medications Prior to Visit  Medication Sig Dispense Refill   albuterol (VENTOLIN HFA) 108 (90 Base) MCG/ACT inhaler Inhale 2 puffs into the lungs every 6 (six) hours as needed for wheezing or shortness of breath. 8 g 0   cholecalciferol (VITAMIN D) 25 MCG (1000 UNIT) tablet Take 1,000 Units by mouth daily.     levothyroxine (SYNTHROID) 200  MCG tablet Take 1 tablet (200 mcg total) by mouth daily before breakfast. 30 tablet 3   tiZANidine (ZANAFLEX) 4 MG tablet Take 1 tablet (4 mg total) by mouth every 6 (six) hours as needed for muscle spasms. 30 tablet 0   dexlansoprazole (DEXILANT) 60 MG capsule Take 1 capsule (60 mg total) by mouth daily. 90 capsule 3   meloxicam (MOBIC) 7.5 MG tablet Take 7.5 mg by mouth daily.     lidocaine (XYLOCAINE) 2 % solution Use as directed 15 mLs in the mouth or throat 2 (two) times daily as needed (chest/esophagus pain. May cause mouth/tongue numbness). (Patient not taking: Reported on 12/07/2021) 100 mL 1   No facility-administered medications prior to visit.    No Known Allergies  ROS Review of Systems  Constitutional:  Negative for chills and fever.  HENT:  Negative for congestion, sinus pressure, sinus pain and sore throat.   Eyes:  Negative for pain and discharge.  Respiratory:  Negative for cough and shortness of breath.   Cardiovascular:  Negative for chest pain and palpitations.  Gastrointestinal:  Positive for abdominal pain (Epigastric, burning). Negative for constipation, diarrhea, nausea and vomiting.  Endocrine: Negative for polydipsia and polyuria.  Genitourinary:  Negative for dysuria and hematuria.  Musculoskeletal:  Negative for neck pain and neck stiffness.  Skin:  Negative for rash.  Neurological:  Negative for dizziness and weakness.  Psychiatric/Behavioral:  Negative for agitation and behavioral problems.      Objective:    Physical Exam Vitals reviewed.  Constitutional:      General: She is not in acute distress.    Appearance: She is not diaphoretic.  HENT:     Head: Normocephalic and atraumatic.     Nose: Nose normal. No congestion.     Mouth/Throat:     Mouth: Mucous membranes are moist.     Pharynx: No posterior oropharyngeal erythema.  Eyes:     General: No scleral icterus.    Extraocular Movements: Extraocular movements intact.  Neck:     Thyroid: No  thyromegaly.  Cardiovascular:     Rate and Rhythm: Normal rate and regular rhythm.     Pulses: Normal pulses.     Heart sounds: Normal heart sounds. No murmur heard. Pulmonary:     Breath sounds: Normal breath sounds. No wheezing or rales.  Abdominal:     Palpations: Abdomen is soft.     Tenderness: There is no abdominal tenderness.  Musculoskeletal:     Cervical back: Neck supple. No tenderness.     Right lower leg: No edema.     Left lower leg: No edema.  Skin:    General: Skin is warm.  Findings: No rash.  Neurological:     General: No focal deficit present.     Mental Status: She is alert and oriented to person, place, and time.  Psychiatric:        Mood and Affect: Mood normal.        Behavior: Behavior normal.    BP 122/82 (BP Location: Right Arm, Patient Position: Sitting, Cuff Size: Normal)    Pulse 92    Resp 18    Ht _0  (1.676 m)    Wt 166 lb (75.3 kg)    SpO2 96%    BMI 26.79 kg/m  Wt Readings from Last 3 Encounters:  02/01/22 166 lb (75.3 kg)  12/07/21 170 lb 3.2 oz (77.2 kg)  10/23/21 171 lb 0.6 oz (77.6 kg)    Lab Results  Component Value Date   TSH 19.100 (H) 10/27/2021   Lab Results  Component Value Date   WBC 7.2 10/27/2021   HGB 14.4 10/27/2021   HCT 43.6 10/27/2021   MCV 88 10/27/2021   PLT 299 10/27/2021   Lab Results  Component Value Date   NA 142 10/27/2021   K 4.7 10/27/2021   CO2 25 10/27/2021   GLUCOSE 90 10/27/2021   BUN 15 10/27/2021   CREATININE 1.16 (H) 10/27/2021   BILITOT 0.4 10/27/2021   ALKPHOS 111 10/27/2021   AST 15 10/27/2021   ALT 12 10/27/2021   PROT 7.3 10/27/2021   ALBUMIN 4.2 10/27/2021   CALCIUM 8.6 (L) 10/27/2021   EGFR 52 (L) 10/27/2021   Lab Results  Component Value Date   CHOL 265 (H) 10/27/2021   Lab Results  Component Value Date   HDL 53 10/27/2021   Lab Results  Component Value Date   LDLCALC 188 (H) 10/27/2021   Lab Results  Component Value Date   TRIG 133 10/27/2021   No results  found for: CHOLHDL No results found for: HGBA1C    Assessment & Plan:   Problem List Items Addressed This Visit       Digestive   GERD (gastroesophageal reflux disease)    On Dexilant Recently completed treatment for H. Pylori Needs to avoid NSAIDs, advised to take Tylenol or Voltaren gel for arthritis Followed by Dr. Abbey Chatters       Relevant Medications   dexlansoprazole (DEXILANT) 60 MG capsule     Endocrine   Acquired hypothyroidism - Primary    Lab Results  Component Value Date   TSH 19.100 (H) 10/27/2021   Needs TSH and free T4 testing Her dose of levothyroxine was increased to 200 mcg daily in the last visit      Relevant Orders   TSH + free T4   Basic Metabolic Panel (BMET)     Musculoskeletal and Integument   Primary osteoarthritis    Of hands and knee Needs to avoid oral NSAIDs as she has GERD Advised to take Tylenol instead Voltaren gel PRN      Other Visit Diagnoses     Epigastric pain       Relevant Medications   dexlansoprazole (DEXILANT) 60 MG capsule       Meds ordered this encounter  Medications   dexlansoprazole (DEXILANT) 60 MG capsule    Sig: Take 1 capsule (60 mg total) by mouth daily.    Dispense:  90 capsule    Refill:  3    Follow-up: Return in about 6 months (around 08/04/2022) for GERD and hypothyroidism.    Lindell Spar, MD

## 2022-02-01 NOTE — Patient Instructions (Addendum)
Please stop taking Meloxicam, Ibuprofen or Naproxen. Okay to use Tylenol or Voltaren gel for arthritis. ? ?Please continue taking medications as prescribed. ? ?Avoid hot and spicy food. ?

## 2022-02-01 NOTE — Assessment & Plan Note (Signed)
Of hands and knee ?Needs to avoid oral NSAIDs as she has GERD ?Advised to take Tylenol instead ?Voltaren gel PRN ?

## 2022-02-01 NOTE — Addendum Note (Signed)
Addended by: Zacarias Pontes R on: 02/01/2022 11:25 AM ? ? Modules accepted: Orders ? ?

## 2022-02-02 ENCOUNTER — Other Ambulatory Visit: Payer: Self-pay | Admitting: Internal Medicine

## 2022-02-02 DIAGNOSIS — E039 Hypothyroidism, unspecified: Secondary | ICD-10-CM

## 2022-02-02 LAB — BASIC METABOLIC PANEL
BUN/Creatinine Ratio: 22 (ref 12–28)
BUN: 17 mg/dL (ref 8–27)
CO2: 24 mmol/L (ref 20–29)
Calcium: 8.4 mg/dL — ABNORMAL LOW (ref 8.7–10.3)
Chloride: 103 mmol/L (ref 96–106)
Creatinine, Ser: 0.77 mg/dL (ref 0.57–1.00)
Glucose: 84 mg/dL (ref 70–99)
Potassium: 3.7 mmol/L (ref 3.5–5.2)
Sodium: 142 mmol/L (ref 134–144)
eGFR: 84 mL/min/{1.73_m2} (ref >59)

## 2022-02-02 LAB — TSH+FREE T4
Free T4: 1.35 ng/dL (ref 0.82–1.77)
TSH: 0.1 u[IU]/mL — ABNORMAL LOW (ref 0.450–4.500)

## 2022-02-02 MED ORDER — LEVOTHYROXINE SODIUM 200 MCG PO TABS: 200.0000 ug | ORAL_TABLET | Freq: Every day | ORAL | 1 refills | Status: DC

## 2022-02-26 ENCOUNTER — Telehealth: Payer: Self-pay

## 2022-02-26 NOTE — Telephone Encounter (Signed)
Pt's PA approved from 11/26/2021 through 02/26/2023 unless notified otherwise. This is a non-formulary drug. Pharmacy aware and given to Manuela Schwartz to scan in pt's chart ?

## 2022-02-26 NOTE — Telephone Encounter (Signed)
PA done fort the pt's Dexilant. Pt has breakthrough symptoms with Pantoprazole. Waiting for a response from Cover My Meds. (Another Dr wrote this Rx.) ?

## 2022-02-26 NOTE — Telephone Encounter (Signed)
error 

## 2022-03-19 ENCOUNTER — Encounter: Payer: Self-pay | Admitting: Internal Medicine

## 2022-04-24 ENCOUNTER — Ambulatory Visit: Payer: Medicare Other | Admitting: Internal Medicine

## 2022-07-11 NOTE — Progress Notes (Unsigned)
Referring Provider: Lindell Spar, MD Primary Care Physician:  Lindell Spar, MD Primary GI Physician: Dr. Abbey Chatters  No chief complaint on file.   HPI:   Ann Reeves is a 66 y.o. female with history of GERD, H pylori gastritis in April 2022 treated with Prevpac with documented eradication, chronic epigastric pain, and constipation,   Last seen in our office in January 2023. She continued with epigastric pain radiating to back/shoulders that was slightly improved compared to prior, but 5-6/10 in severity. Also with chronic constipation. Recommended RUQ [place] and starting MiraLAX daily.   RUQ [place] was normal.   Follow-up HIDA was also normal.   Today:   Past Medical History:  Diagnosis Date   Hypertension    Hyperthyroidism- had goiter removed in 1998     Past Surgical History:  Procedure Laterality Date   BIOPSY  02/27/2021   Procedure: BIOPSY;  Surgeon: [name], DO;  [facility];  Service: Endoscopy;;  gastric   ESOPHAGOGASTRODUODENOSCOPY (EGD) WITH PROPOFOL N/A 02/27/2021   Procedure: ESOPHAGOGASTRODUODENOSCOPY (EGD) WITH PROPOFOL;  Surgeon: [name], DO;  [facility];  Service: Endoscopy;  Laterality: N/A;  PM   THYROID SURGERY      Current Outpatient Medications  Medication Sig Dispense Refill   albuterol (VENTOLIN HFA) 108 (90 Base) MCG/ACT inhaler Inhale 2 puffs into the lungs every 6 (six) hours as needed for wheezing or shortness of breath. 8 g 0   cholecalciferol (VITAMIN D) 25 MCG (1000 UNIT) tablet Take 1,000 Units by mouth daily.     dexlansoprazole (DEXILANT) 60 MG capsule Take 1 capsule (60 mg total) by mouth daily. 90 capsule 3   levothyroxine (SYNTHROID) 200 MCG tablet Take 1 tablet (200 mcg total) by mouth daily before breakfast. 90 tablet 1   tiZANidine (ZANAFLEX) 4 MG tablet Take 1 tablet (4 mg total) by mouth every 6 (six) hours as needed for muscle spasms. 30 tablet 0   No current facility-administered  medications for this visit.    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[place]  Tobacco Use   Smoking status: Former    Packs/day: 0.50    Years: 10.00    Total pack years: 5.00    Types: Cigarettes   Smokeless tobacco: Never  Vaping Use   Vaping Use: Never used  Substance and Sexual Activity   Alcohol use: Yes    Comment: occ   Drug use: Never   Sexual activity: Yes    Birth control/protection: Post-menopausal  Other Topics Concern   Not on file  Social History Narrative   Son lives in [place]; family lives in [place], moved to [place] at age 37   Social Determinants of Health   Financial Resource Strain: [place]  (08/18/2021)   Overall Financial Resource Strain (CARDIA)    Difficulty of Paying Living Expenses: Not hard at all  Food Insecurity: No [place] (08/18/2021)   Hunger Vital Sign    Worried About [place] in the Last Year: Never  true    Ran Out of Food in the Last Year: Never true  Transportation Needs: No Transportation Needs (08/18/2021)   PRAPARE - Hydrologist (Medical): No    Lack of Transportation (Non-Medical): No  Physical Activity: Inactive (08/18/2021)   Exercise Vital Sign    Days of Exercise per Week: 0 days    Minutes of Exercise per Session: 0 min  Stress: No Stress Concern Present (08/18/2021)   [place]    Feeling of Stress : Not at all  Social Connections: Moderately Isolated (08/18/2021)   Social Connection and Isolation Panel [NHANES]    Frequency of Communication with Friends  and Family: More than three times a week    Frequency of Social Gatherings with Friends and Family: More than three times a week    Attends Religious Services: Never    Marine scientist or Organizations: No    Attends Music therapist: Never    Marital Status: Married    Review of Systems: Gen: Denies fever, chills,cold or flu like symptoms, pre-syncope, or syncope.  CV: Denies chest pain, palpitations. Resp: Denies dyspnea, cough.  GI: See HPI Heme:  See HPI  Physical Exam: There were no vitals taken for this visit. General:   Alert and oriented. No distress noted. Pleasant and cooperative.  Head:  Normocephalic and atraumatic. Eyes:  Conjuctiva clear without scleral icterus. Heart:  S1, S2 present without murmurs appreciated. Lungs:  Clear to auscultation bilaterally. No wheezes, rales, or rhonchi. No distress.  Abdomen:  +BS, soft, non-tender and non-distended. No rebound or guarding. No HSM or masses noted. Msk:  Symmetrical without gross deformities. Normal posture. Extremities:  Without edema. Neurologic:  Alert and  oriented x4 Psych:  Normal mood and affect.    Assessment:     Plan:  ***   [name], PA-C [facility] 07/12/2022

## 2022-07-12 ENCOUNTER — Ambulatory Visit (INDEPENDENT_AMBULATORY_CARE_PROVIDER_SITE_OTHER): Payer: Medicare Other | Admitting: Gastroenterology

## 2022-07-12 ENCOUNTER — Encounter: Payer: Self-pay | Admitting: Gastroenterology

## 2022-07-12 ENCOUNTER — Encounter: Payer: Self-pay | Admitting: *Deleted

## 2022-07-12 VITALS — BP 115/69 | HR 73 | Temp 97.8°F | Ht 66.0 in | Wt 156.4 lb

## 2022-07-12 DIAGNOSIS — K219 Gastro-esophageal reflux disease without esophagitis: Secondary | ICD-10-CM

## 2022-07-12 DIAGNOSIS — K5900 Constipation, unspecified: Secondary | ICD-10-CM | POA: Diagnosis not present

## 2022-07-12 DIAGNOSIS — R1013 Epigastric pain: Secondary | ICD-10-CM

## 2022-07-12 MED ORDER — RABEPRAZOLE SODIUM 20 MG PO TBEC: 20.0000 mg | DELAYED_RELEASE_TABLET | Freq: Two times a day (BID) | ORAL | 3 refills | Status: DC

## 2022-07-12 NOTE — Patient Instructions (Signed)
We will treat have a gastric emptying study at [place] and start Aciphex 20 mg twice daily 30 minutes before breakfast and dinner.  Follow a GERD diet:  Avoid fried, fatty, greasy, spicy, citrus foods. Avoid caffeine and carbonated beverages. Avoid chocolate. Try eating 4-6 small meals a day rather than 3 large meals. Do not eat within 3 hours of laying down. Prop head of bed up on wood or bricks to create a 6 inch incline.  Please call with a progress report in 4 weeks.  If you are still having a lot of burning in the upper abdomen, we will consider doing a CT scan for you.  We will plan to see you back in about 3 months. Do not hesitate to call if you have questions or concerns prior to your next visit.  [name], PA-C [facility]

## 2022-07-13 ENCOUNTER — Other Ambulatory Visit: Payer: Self-pay

## 2022-07-13 MED ORDER — ALBUTEROL SULFATE HFA 108 (90 BASE) MCG/ACT IN AERS: 2.0000 | INHALATION_SPRAY | Freq: Four times a day (QID) | RESPIRATORY_TRACT | 0 refills | Status: DC | PRN

## 2022-07-17 ENCOUNTER — Ambulatory Visit (HOSPITAL_COMMUNITY): Payer: Medicare Other

## 2022-07-23 ENCOUNTER — Other Ambulatory Visit: Payer: Self-pay | Admitting: Internal Medicine

## 2022-07-25 ENCOUNTER — Ambulatory Visit (HOSPITAL_COMMUNITY)
Admission: RE | Admit: 2022-07-25 | Discharge: 2022-07-25 | Disposition: A | Discharge status: Home / Self Care | Payer: Medicare Other | Source: Ambulatory Visit | Attending: Gastroenterology | Admitting: Gastroenterology

## 2022-07-25 DIAGNOSIS — R1013 Epigastric pain: Secondary | ICD-10-CM | POA: Insufficient documentation

## 2022-07-25 DIAGNOSIS — R6881 Early satiety: Secondary | ICD-10-CM | POA: Diagnosis not present

## 2022-07-25 MED ORDER — TECHNETIUM TC 99M SULFUR COLLOID
2.0000 | Freq: Once | INTRAVENOUS | Status: AC | PRN
Start: 2022-07-25 — End: 2022-07-25
  Administered 2022-07-25: 2.1 via INTRAVENOUS

## 2022-08-09 ENCOUNTER — Ambulatory Visit: Payer: Medicare Other | Admitting: Internal Medicine

## 2022-08-15 ENCOUNTER — Other Ambulatory Visit (HOSPITAL_COMMUNITY): Payer: Self-pay | Admitting: Internal Medicine

## 2022-08-15 DIAGNOSIS — Z1231 Encounter for screening mammogram for malignant neoplasm of breast: Secondary | ICD-10-CM

## 2022-08-17 ENCOUNTER — Ambulatory Visit: Payer: Medicare Other | Admitting: Internal Medicine

## 2022-08-17 ENCOUNTER — Encounter: Payer: Self-pay | Admitting: Internal Medicine

## 2022-08-21 ENCOUNTER — Other Ambulatory Visit: Payer: Self-pay

## 2022-08-21 ENCOUNTER — Ambulatory Visit (INDEPENDENT_AMBULATORY_CARE_PROVIDER_SITE_OTHER): Payer: Medicare Other

## 2022-08-21 DIAGNOSIS — Z0000 Encounter for general adult medical examination without abnormal findings: Secondary | ICD-10-CM | POA: Diagnosis not present

## 2022-08-21 DIAGNOSIS — E039 Hypothyroidism, unspecified: Secondary | ICD-10-CM

## 2022-08-21 MED ORDER — LEVOTHYROXINE SODIUM 200 MCG PO TABS: 200.0000 ug | ORAL_TABLET | Freq: Every day | ORAL | 1 refills | Status: DC

## 2022-08-21 MED ORDER — ALBUTEROL SULFATE HFA 108 (90 BASE) MCG/ACT IN AERS: 2.0000 | INHALATION_SPRAY | Freq: Four times a day (QID) | RESPIRATORY_TRACT | 0 refills | Status: DC | PRN

## 2022-08-21 NOTE — Progress Notes (Signed)
Subjective:   Ann Reeves is a 66 y.o. female who presents for Medicare Annual (Subsequent) preventive examination.  Review of Systems    I connected with  Ann Reeves on 08/21/22 by a audio enabled telemedicine application and verified that I am speaking with the correct person using two identifiers.  Patient Location: Home  Provider Location: Office/Clinic  I discussed the limitations of evaluation and management by telemedicine. The patient expressed understanding and agreed to proceed.        Objective:    There were no vitals filed for this visit. There is no height or weight on file to calculate BMI.     08/18/2021    4:05 PM 02/27/2021    9:15 AM 08/11/2017   11:58 AM  Advanced Directives  Does Patient Have a Medical Advance Directive? No No No  Would patient like information on creating a medical advance directive? Yes (MAU/Ambulatory/Procedural Areas - Information given) Yes (MAU/Ambulatory/Procedural Areas - Information given)     Current Medications (verified) Outpatient Encounter Medications as of 08/21/2022  Medication Sig   albuterol (VENTOLIN HFA) 108 (90 Base) MCG/ACT inhaler Inhale 2 puffs into the lungs every 6 (six) hours as needed for wheezing or shortness of breath.   cholecalciferol (VITAMIN D) 25 MCG (1000 UNIT) tablet Take 1,000 Units by mouth daily.   levothyroxine (SYNTHROID) 200 MCG tablet Take 1 tablet (200 mcg total) by mouth daily before breakfast.   RABEprazole (ACIPHEX) 20 MG tablet Take 1 tablet (20 mg total) by mouth 2 (two) times daily before a meal.   No facility-administered encounter medications on file as of 08/21/2022.    Allergies (verified) Patient has no known allergies.   History: Past Medical History:  Diagnosis Date   Helicobacter pylori gastritis 02/2021   Treated with Prevpac.  Documented eradication in December 2022.   Hypertension    Hyperthyroidism- had goiter removed in 1998    Past Surgical History:  Procedure  Laterality Date   BIOPSY  02/27/2021   Procedure: BIOPSY;  Surgeon: Ann Harman, DO;  [facility];  Service: Endoscopy;;  gastric   ESOPHAGOGASTRODUODENOSCOPY (EGD) WITH PROPOFOL N/A 02/27/2021   Surgeon: Ann Harman, DO; H. pylori gastritis.  Treated with Prevpac.   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 [place]  Tobacco Use   Smoking status: Former    Packs/day: 0.50    Years: 10.00    Total pack years: 5.00    Types: Cigarettes   Smokeless tobacco: Never  Vaping Use   Vaping Use: Never used  Substance and Sexual Activity   Alcohol use: Yes    Comment: occ   Drug use: Never   Sexual activity: Yes    Birth control/protection: Post-menopausal  Other Topics Concern   Not on file  Social History Narrative   Son lives in [place]; family lives in [place], moved to [place] at age 72   Social Determinants of Health   Financial Resource Strain: [place]  (08/18/2021)   Overall Financial Resource Strain (CARDIA)    Difficulty of Paying Living Expenses: Not hard  at all  Food Insecurity: No Food Insecurity (08/18/2021)   Hunger Vital Sign    Worried About Running Out of Food in the Last Year: Never true    Ran Out of Food in the Last Year: Never true  Transportation Needs: No Transportation Needs (08/18/2021)   PRAPARE - Hydrologist (Medical): No    Lack of Transportation (Non-Medical): No  Physical Activity: Inactive (08/18/2021)   Exercise Vital Sign    Days of Exercise per Week: 0 days    Minutes of Exercise per Session: 0 min  Stress: No Stress Concern Present (08/18/2021)   [place]    Feeling of Stress : Not at all  Social Connections: Moderately Isolated (08/18/2021)   Social Connection and Isolation Panel [NHANES]    Frequency of Communication with Friends and Family: More than three times a week    Frequency of Social Gatherings with Friends and Family: More than three times a week    Attends Religious Services: Never    Marine scientist or Organizations: No    Attends Archivist Meetings: Never    Marital Status: Married    Tobacco Counseling Counseling given: Not Answered   Clinical Intake:              How often do you need to have someone help you when you read instructions, pamphlets, or other written materials from your doctor or pharmacy?: (P) 1 - Never  Diabetic?NO         Activities of Daily Living    08/14/2022    5:53 PM  In your present state of health, do you have any difficulty performing the following activities:  Hearing? 0  Vision? 0  Difficulty concentrating or making decisions? 0  Walking or climbing stairs? 0  Dressing or bathing? 0  Doing errands, shopping? 0  Preparing Food and eating ? N  Using the Toilet? N  In the past six months, have you accidently leaked urine? N  Do you have problems with loss of bowel control? N  Managing your Medications? N  Managing your Finances? N  Housekeeping or managing your Housekeeping? N    Patient Care Team: Ann Spar, MD as PCP - General (Internal Medicine) Ann Harman, DO as Consulting Physician (Internal Medicine)  Indicate any recent Medical Services you may have received from other than [facility] providers in the past year (date may be approximate).     Assessment:   This is a routine wellness examination for Ann Reeves.  Hearing/Vision screen No results found.  Dietary issues and exercise activities discussed:     Goals Addressed   None   Depression Screen    02/01/2022   10:31 AM 10/23/2021     9:40 AM 08/18/2021    4:06 PM 08/18/2021    4:01 PM 07/26/2021    1:28 PM  PHQ 2/9 Scores  PHQ - 2 Score 0 0 0 0 0    Fall Risk    08/14/2022    5:53 PM 02/01/2022   10:31 AM 10/23/2021    9:39 AM 08/18/2021    4:05 PM 07/26/2021    1:28 PM  Fall Risk   Falls in the past year? 1 1 0 0 1  Number falls in past yr: 0 0 0 0 1  Injury with Fall? 0 0 0 0 0  Risk for fall due to :  History of  fall(s);Impaired mobility No Fall Risks No Fall Risks Impaired balance/gait  Follow up  Falls evaluation completed;Falls prevention discussed Falls evaluation completed Falls evaluation completed Falls evaluation completed    FALL RISK PREVENTION PERTAINING TO THE HOME:  Any stairs in or around the home? Yes  If so, are there any without handrails? Yes  Home free of loose throw rugs in walkways, pet beds, electrical cords, etc? Yes  Adequate lighting in your home to reduce risk of falls? Yes   ASSISTIVE DEVICES UTILIZED TO PREVENT FALLS:  Life alert? No  Use of a cane, walker or w/c? No  Grab bars in the bathroom? Yes  Shower chair or bench in shower? Yes  Elevated toilet seat or a handicapped toilet? Yes    Cognitive Function:    08/18/2021    4:07 PM  MMSE - Mini Mental State Exam  Not completed: Unable to complete        08/18/2021    4:07 PM  6CIT Screen  What Year? 0 points  What month? 0 points  What time? 0 points  Count back from 20 0 points  Months in reverse 0 points  Repeat phrase 0 points  Total Score 0 points    Immunizations Immunization History  Administered Date(s) Administered   Moderna SARS-COV2 Booster Vaccination 01/08/2020, 02/06/2020, 11/07/2020   PNEUMOCOCCAL CONJUGATE-20 02/01/2022   Tdap 08/11/2017    TDAP status: Up to date  Flu Vaccine status: Due, Education has been provided regarding the importance of this vaccine. Advised may receive this vaccine at local pharmacy or Health Dept. Aware to provide a copy of the vaccination record if obtained from  local pharmacy or Health Dept. Verbalized acceptance and understanding.  Pneumococcal vaccine status: Up to date  Covid-19 vaccine status: Completed vaccines  Qualifies for Shingles Vaccine? Yes   Zostavax completed No   Shingrix Completed?: No.    Education has been provided regarding the importance of this vaccine. Patient has been advised to call insurance company to determine out of pocket expense if they have not yet received this vaccine. Advised may also receive vaccine at local pharmacy or Health Dept. Verbalized acceptance and understanding.  Screening Tests Health Maintenance  Topic Date Due   COLONOSCOPY (Pts 45-31yr Insurance coverage will need to be confirmed)  Never done   Zoster Vaccines- Shingrix (1 of 2) Never done   DEXA SCAN  Never done   COVID-19 Vaccine (1) 11/07/2020   INFLUENZA VACCINE  Never done   MAMMOGRAM  09/05/2023   TETANUS/TDAP  08/12/2027   Pneumonia Vaccine 66 Years old  Completed   Hepatitis C Screening  Completed   HPV VACCINES  Aged Out    Health Maintenance  Health Maintenance Due  Topic Date Due   COLONOSCOPY (Pts 45-432yrInsurance coverage will need to be confirmed)  Never done   Zoster Vaccines- Shingrix (1 of 2) Never done   DEXA SCAN  Never done   COVID-19 Vaccine (1) 11/07/2020   INFLUENZA VACCINE  Never done    Colorectal cancer screening: Referral to GI placed  . Pt aware the office will call re: appt.  Mammogram status: Completed 09/04/21. Repeat every year  Bone Density status: Ordered  . Pt provided with contact info and advised to call to schedule appt.  Lung Cancer Screening: (Low Dose CT Chest recommended if Age 68-80ears, 30 pack-year currently smoking OR have quit w/in 15years.) does not qualify.   Lung Cancer Screening Referral: NO  Additional Screening:  Hepatitis C Screening: does qualify; Completed 10/27/21  Vision Screening: Recommended annual ophthalmology exams for early detection of glaucoma and other  disorders of the eye. Is the patient up to date with their annual eye exam?  No  Who is the provider or what is the name of the office in which the patient attends annual eye exams? /a If pt is not established with a provider, would they like to be referred to a provider to establish care? No .   Dental Screening: Recommended annual dental exams for proper oral hygiene  Community Resource Referral / Chronic Care Management: CRR required this visit?  No   CCM required this visit?  No      Plan:     I have personally reviewed and noted the following in the patient's chart:   Medical and social history Use of alcohol, tobacco or illicit drugs  Current medications and supplements including opioid prescriptions. Patient is not currently taking opioid prescriptions. Functional ability and status Nutritional status Physical activity Advanced directives List of other physicians Hospitalizations, surgeries, and ER visits in previous 12 months Vitals Screenings to include cognitive, depression, and falls Referrals and appointments  In addition, I have reviewed and discussed with patient certain preventive protocols, quality metrics, and best practice recommendations. A written personalized care plan for preventive services as well as general preventive health recommendations were provided to patient.     Quentin Angst, [place]   08/21/2022

## 2022-08-21 NOTE — Patient Instructions (Signed)
  Ann Reeves , Thank you for taking time to come for your Medicare Wellness Visit. I appreciate your ongoing commitment to your health goals. Please review the following plan we discussed and let me know if I can assist you in the future.   These are the goals we discussed:  Goals      Patient Stated     PATIENT [employer]      Patient Stated     Stop smoking.        This is a list of the screening recommended for you and due dates:  Health Maintenance  Topic Date Due   Colon Cancer Screening  Never done   Zoster (Shingles) Vaccine (1 of 2) Never done   DEXA scan (bone density measurement)  Never done   COVID-19 Vaccine (1) 11/07/2020   Flu Shot  Never done   Mammogram  09/05/2023   Tetanus Vaccine  08/12/2027   Pneumonia Vaccine  Completed   Hepatitis C Screening: USPSTF Recommendation to screen - Ages 18-79 yo.  Completed   HPV Vaccine  Aged Out

## 2022-08-27 ENCOUNTER — Encounter: Payer: Self-pay | Admitting: Internal Medicine

## 2022-08-27 ENCOUNTER — Ambulatory Visit (INDEPENDENT_AMBULATORY_CARE_PROVIDER_SITE_OTHER): Payer: Medicare Other | Admitting: Internal Medicine

## 2022-08-27 VITALS — BP 108/62 | HR 66 | Resp 18 | Ht 66.0 in | Wt 147.8 lb

## 2022-08-27 DIAGNOSIS — E039 Hypothyroidism, unspecified: Secondary | ICD-10-CM | POA: Diagnosis not present

## 2022-08-27 DIAGNOSIS — K219 Gastro-esophageal reflux disease without esophagitis: Secondary | ICD-10-CM | POA: Diagnosis not present

## 2022-08-27 DIAGNOSIS — E782 Mixed hyperlipidemia: Secondary | ICD-10-CM | POA: Diagnosis not present

## 2022-08-27 HISTORY — DX: Mixed hyperlipidemia: E78.2

## 2022-08-27 NOTE — Assessment & Plan Note (Signed)
On Rabeprazole Needs to avoid NSAIDs, advised to take Tylenol or Voltaren gel for arthritis Followed by Dr. Abbey Chatters

## 2022-08-27 NOTE — Patient Instructions (Addendum)
Please continue taking medications as prescribed.  Please avoid hot and spicy food to avoid acid reflux.  Please get blood tests done within a week.  Please consider getting Shingrix vaccine at local pharmacy.

## 2022-08-27 NOTE — Assessment & Plan Note (Addendum)
Had not started treatment as her thyroid levels were abnormal in the past Recheck lipid profile

## 2022-08-27 NOTE — Progress Notes (Signed)
 Established Patient Office Visit  Subjective:  Patient ID: Ann Reeves, female    DOB: 06/12/1956  Age: 66 y.o. MRN: [id number]  CC:  Chief Complaint  Patient presents with   Follow-up    Follow up acid reflux feels like it getting better     HPI Ann Reeves is a 66 y.o. female with past medical history of GERD, hypothyroidism and sciatica who presents for f/u of her chronic medical conditions.  GERD: She has been feeling better with the omeprazole now.  She has been treated for H. pylori in the past.  She denies any dysphagia or odynophagia currently.  She had gastric emptying study recently, which was unremarkable.  Hypothyroidism: Her TSH was low, but her free T4 was WNL in the last visit.  She has been taking levothyroxine 200 mcg daily.  Denies any tremors or palpitations currently.  She has lost 19 lbs since the last visit, but states that it is due to diet change as she has cut down chocolates, caffeinated drinks and fried food.  Past Medical History:  Diagnosis Date   GERD (gastroesophageal reflux disease)    Helicobacter pylori gastritis 02/2021   Treated with Prevpac.  Documented eradication in December 2022.   Hypertension    Hyperthyroidism- had goiter removed in 1998    Mixed hyperlipidemia 08/27/2022    Past Surgical History:  Procedure Laterality Date   BIOPSY  02/27/2021   Procedure: BIOPSY;  Surgeon: Carver, Charles K, DO;  [facility];  Service: Endoscopy;;  gastric   ESOPHAGOGASTRODUODENOSCOPY (EGD) WITH PROPOFOL N/A 02/27/2021   Surgeon: Carver, Charles K, DO; H. pylori gastritis.  Treated with Prevpac.   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 [place]  Tobacco Use   Smoking status: Former    Packs/day: 0.50    Years: 10.00    Total pack years: 5.00    Types: Cigarettes   Smokeless tobacco: Never  Vaping Use   Vaping Use: Never used  Substance and Sexual Activity   Alcohol use: Yes    Alcohol/week: 2.0 standard drinks of alcohol    Types: 2 Glasses of wine per week    Comment: occ   Drug use: Never   Sexual activity: Yes    Birth control/protection: Post-menopausal  Other Topics Concern   Not on file  Social History Narrative   Son lives in [place]; family lives in [place], moved to [place] at age 3   Social Determinants of Health   Financial Resource Strain: Low Risk  (08/14/2022)   Overall Financial Resource Strain (CARDIA)    Difficulty of Paying Living Expenses: Not hard at all  Food Insecurity: No Food Insecurity (08/14/2022)   Hunger Vital Sign    Worried About Running Out of Food in the Last Year: Never true    Ran Out of Food in the Last Year: Never true  Transportation Needs: No Transportation Needs (08/14/2022)   PRAPARE - Transportation    Lack of Transportation (Medical): No    Lack of Transportation (Non-Medical): No    Physical Activity: Inactive (08/14/2022)   Exercise Vital Sign    Days of Exercise per Week: 0 days    Minutes of Exercise per Session: 0 min  Stress: No Stress Concern Present (08/14/2022)   [place]    Feeling of Stress : Only a little  Social Connections: Unknown (08/14/2022)   Social Connection and Isolation Panel [NHANES]    Frequency of Communication with Friends and Family: More than three times a week    Frequency of Social Gatherings with Friends and Family: More than three times a week    Attends Religious Services: Not on file    Active Member of Clubs or Organizations: No    Attends Archivist Meetings: Never    Marital Status: Married   Human resources officer Violence: Not At Risk (08/18/2021)   Humiliation, Afraid, Rape, and Kick questionnaire    Fear of Current or Ex-Partner: No    Emotionally Abused: No    Physically Abused: No    Sexually Abused: No    Outpatient Medications Prior to Visit  Medication Sig Dispense Refill   albuterol (VENTOLIN HFA) 108 (90 Base) MCG/ACT inhaler Inhale 2 puffs into the lungs every 6 (six) hours as needed for wheezing or shortness of breath. 8 g 0   cholecalciferol (VITAMIN D) 25 MCG (1000 UNIT) tablet Take 1,000 Units by mouth daily.     levothyroxine (SYNTHROID) 200 MCG tablet Take 1 tablet (200 mcg total) by mouth daily before breakfast. 90 tablet 1   RABEprazole (ACIPHEX) 20 MG tablet Take 1 tablet (20 mg total) by mouth 2 (two) times daily before a meal. 60 tablet 3   No facility-administered medications prior to visit.    No Known Allergies  ROS Review of Systems  Constitutional:  Negative for chills and fever.  HENT:  Negative for congestion, sinus pressure, sinus pain and sore throat.   Eyes:  Negative for pain and discharge.  Respiratory:  Negative for cough and shortness of breath.   Cardiovascular:  Negative for chest pain and palpitations.  Gastrointestinal:  Positive for abdominal pain (Epigastric, burning). Negative for constipation, diarrhea, nausea and vomiting.  Endocrine: Negative for polydipsia and polyuria.  Genitourinary:  Negative for dysuria and hematuria.  Musculoskeletal:  Negative for neck pain and neck stiffness.  Skin:  Negative for rash.  Neurological:  Negative for dizziness and weakness.  Psychiatric/Behavioral:  Negative for agitation and behavioral problems.       Objective:    Physical Exam Vitals reviewed.  Constitutional:      General: She is not in acute distress.    Appearance: She is not diaphoretic.  HENT:     Head: Normocephalic and atraumatic.     Nose: Nose normal. No congestion.     Mouth/Throat:     Mouth: Mucous membranes are  moist.     Pharynx: No posterior oropharyngeal erythema.  Eyes:     General: No scleral icterus.    Extraocular Movements: Extraocular movements intact.  Neck:     Thyroid: No thyromegaly.  Cardiovascular:     Rate and Rhythm: Normal rate and regular rhythm.     Pulses: Normal pulses.     Heart sounds: Normal heart sounds. No murmur heard. Pulmonary:     Breath sounds: Normal breath sounds. No wheezing or rales.  Abdominal:     Palpations: Abdomen is soft.     Tenderness: There is no abdominal tenderness.  Musculoskeletal:  Cervical back: Neck supple. No tenderness.     Right lower leg: No edema.     Left lower leg: No edema.  Skin:    General: Skin is warm.     Findings: No rash.  Neurological:     General: No focal deficit present.     Mental Status: She is alert and oriented to person, place, and time.  Psychiatric:        Mood and Affect: Mood normal.        Behavior: Behavior normal.     BP 108/62 (BP Location: Right Arm, Patient Position: Sitting, Cuff Size: Normal)   Pulse 66   Resp 18   Ht 5' 6" (1.676 m)   Wt 147 lb 12.8 oz (67 kg)   SpO2 99%   BMI 23.86 kg/m  Wt Readings from Last 3 Encounters:  08/27/22 147 lb 12.8 oz (67 kg)  07/12/22 156 lb 6.4 oz (70.9 kg)  02/01/22 166 lb (75.3 kg)    Lab Results  Component Value Date   TSH 0.100 (L) 02/01/2022   Lab Results  Component Value Date   WBC 7.2 10/27/2021   HGB 14.4 10/27/2021   HCT 43.6 10/27/2021   MCV 88 10/27/2021   PLT 299 10/27/2021   Lab Results  Component Value Date   NA 142 02/01/2022   K 3.7 02/01/2022   CO2 24 02/01/2022   GLUCOSE 84 02/01/2022   BUN 17 02/01/2022   CREATININE 0.77 02/01/2022   BILITOT 0.4 10/27/2021   ALKPHOS 111 10/27/2021   AST 15 10/27/2021   ALT 12 10/27/2021   PROT 7.3 10/27/2021   ALBUMIN 4.2 10/27/2021   CALCIUM 8.4 (L) 02/01/2022   EGFR 84 02/01/2022   Lab Results  Component Value Date   CHOL 265 (H) 10/27/2021   Lab Results  Component  Value Date   HDL 53 10/27/2021   Lab Results  Component Value Date   LDLCALC 188 (H) 10/27/2021   Lab Results  Component Value Date   TRIG 133 10/27/2021   No results found for: "CHOLHDL" No results found for: "HGBA1C"    Assessment & Plan:   Problem List Items Addressed This Visit       Digestive   Gastroesophageal reflux disease    On Rabeprazole Needs to avoid NSAIDs, advised to take Tylenol or Voltaren gel for arthritis Followed by Dr. Carver        Endocrine   Acquired hypothyroidism - Primary    Lab Results  Component Value Date   TSH 0.100 (L) 02/01/2022   Needs TSH and free T4 testing On levothyroxine 200 mcg QD      Relevant Orders   TSH + free T4   CMP14+EGFR     Other   Mixed hyperlipidemia    Had not started treatment as her thyroid levels were abnormal in the past Recheck lipid profile      Relevant Orders   CMP14+EGFR   Lipid Profile    No orders of the defined types were placed in this encounter.   Follow-up: Return in about 6 months (around 02/26/2023) for Hypothyroidism.    Rutwik K Patel, MD 

## 2022-08-27 NOTE — Assessment & Plan Note (Signed)
Lab Results  Component Value Date   TSH 0.100 (L) 02/01/2022    Needs TSH and free T4 testing On levothyroxine 200 mcg QD 

## 2022-09-07 ENCOUNTER — Ambulatory Visit (HOSPITAL_COMMUNITY)
Admission: RE | Admit: 2022-09-07 | Discharge: 2022-09-07 | Disposition: A | Discharge status: Home / Self Care | Payer: Medicare Other | Source: Ambulatory Visit | Attending: Internal Medicine | Admitting: Internal Medicine

## 2022-09-07 DIAGNOSIS — Z1231 Encounter for screening mammogram for malignant neoplasm of breast: Secondary | ICD-10-CM | POA: Diagnosis not present

## 2022-09-07 DIAGNOSIS — Z780 Asymptomatic menopausal state: Secondary | ICD-10-CM | POA: Diagnosis present

## 2022-09-26 ENCOUNTER — Encounter: Payer: Self-pay | Admitting: *Deleted

## 2022-10-01 ENCOUNTER — Other Ambulatory Visit: Payer: Self-pay | Admitting: Internal Medicine

## 2022-10-11 ENCOUNTER — Other Ambulatory Visit: Payer: Self-pay | Admitting: Internal Medicine

## 2022-10-11 ENCOUNTER — Telehealth: Payer: Self-pay

## 2022-10-11 DIAGNOSIS — R1013 Epigastric pain: Secondary | ICD-10-CM

## 2022-10-11 DIAGNOSIS — K219 Gastro-esophageal reflux disease without esophagitis: Secondary | ICD-10-CM

## 2022-10-11 MED ORDER — RABEPRAZOLE SODIUM 20 MG PO TBEC: 20.0000 mg | DELAYED_RELEASE_TABLET | Freq: Two times a day (BID) | ORAL | 3 refills | Status: DC

## 2022-10-11 NOTE — Telephone Encounter (Signed)
Sent to pharmacy 

## 2022-10-11 NOTE — Telephone Encounter (Signed)
Refill request from [facility] for RABEprazole Sodium '20MG'$  TAB QTY: 180. Pt was last seen on 07/12/2022 by [name], routing to you in her absence since you seen her the time before.

## 2022-10-23 ENCOUNTER — Other Ambulatory Visit: Payer: Self-pay | Admitting: Internal Medicine

## 2022-11-20 ENCOUNTER — Other Ambulatory Visit: Payer: Self-pay | Admitting: Internal Medicine

## 2022-12-05 IMAGING — NM NM HEPATO W/GB/PHARM/[PERSON_NAME]
2 series · 12 of 12 positions shown · non-contrast
Comparison: Ultrasound 12/14/2021.

CLINICAL DATA: Right upper quadrant pain.

EXAM:
NUCLEAR MEDICINE HEPATOBILIARY IMAGING WITH GALLBLADDER EF
TECHNIQUE: Sequential images of the abdomen were obtained [DATE] minutes
following intravenous administration of radiopharmaceutical. After
oral ingestion of Ensure, gallbladder ejection fraction was
determined. At 60 min, normal ejection fraction is greater than 33%.
RADIOPHARMACEUTICALS:  5.2 mCi Uc-XXm  Choletec IV

[Series 1: biliary · 3.25mm/px · 6 of 60 frames shown]
[frame 6/60]
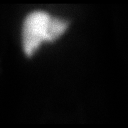
[frame 16/60]
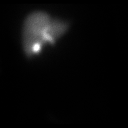
[frame 26/60]
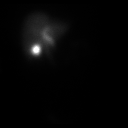
[frame 36/60]
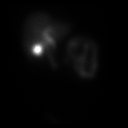
[frame 46/60]
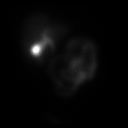
[frame 56/60]
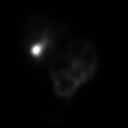

[Series 2: gbef · 3.25mm/px · 6 of 60 frames shown]
[frame 6/60]
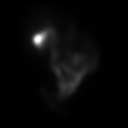
[frame 16/60]
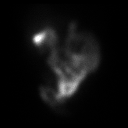
[frame 26/60]
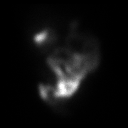
[frame 36/60]
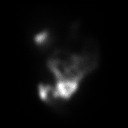
[frame 46/60]
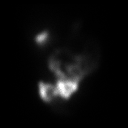
[frame 56/60]
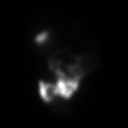

[12 of 12 positions shown; findings below may reference images not displayed]

FINDINGS: Prompt uptake and biliary excretion of activity by the liver is
seen. Gallbladder activity is visualized, consistent with patency of
cystic duct. Biliary activity passes into small bowel, consistent
with patent common bile duct.

Calculated gallbladder ejection fraction is 73%. (Normal gallbladder
ejection fraction with Ensure is greater than 33%.)
IMPRESSION: Normal exam with normal gallbladder ejection fraction.

## 2022-12-11 ENCOUNTER — Telehealth: Payer: Self-pay | Admitting: Internal Medicine

## 2022-12-11 NOTE — Telephone Encounter (Signed)
Patient called and said she got an alert on MyChart that it was time for her colonoscopy.  She wants a questionnaire mailed to her.

## 2022-12-11 NOTE — Telephone Encounter (Signed)
Patient called said she called [facility] to scheduled an colonoscopy said her pcp needed to schedule, please contact patient.

## 2022-12-11 NOTE — Telephone Encounter (Signed)
I don't see any documentation that it is time for TCS or that patient has ever had one other than a sentence in [facility] OV note. Please let patient know she will need to discuss with provider at time of her OV in February

## 2022-12-11 NOTE — Telephone Encounter (Signed)
Spoke to patient

## 2022-12-13 ENCOUNTER — Ambulatory Visit: Payer: Medicare Other | Admitting: Internal Medicine

## 2022-12-17 ENCOUNTER — Other Ambulatory Visit: Payer: Self-pay

## 2022-12-17 ENCOUNTER — Emergency Department (HOSPITAL_COMMUNITY)
Admission: EM | Admit: 2022-12-17 | Discharge: 2022-12-17 | Disposition: A | Discharge status: Home / Self Care | Payer: Medicare Other | Attending: Emergency Medicine | Admitting: Emergency Medicine

## 2022-12-17 ENCOUNTER — Encounter (HOSPITAL_COMMUNITY): Payer: Self-pay | Admitting: Emergency Medicine

## 2022-12-17 DIAGNOSIS — R079 Chest pain, unspecified: Secondary | ICD-10-CM

## 2022-12-17 DIAGNOSIS — I10 Essential (primary) hypertension: Secondary | ICD-10-CM | POA: Insufficient documentation

## 2022-12-17 DIAGNOSIS — U071 COVID-19: Secondary | ICD-10-CM

## 2022-12-17 DIAGNOSIS — R748 Abnormal levels of other serum enzymes: Secondary | ICD-10-CM | POA: Diagnosis not present

## 2022-12-17 DIAGNOSIS — E876 Hypokalemia: Secondary | ICD-10-CM | POA: Insufficient documentation

## 2022-12-17 DIAGNOSIS — R059 Cough, unspecified: Secondary | ICD-10-CM | POA: Diagnosis present

## 2022-12-17 DIAGNOSIS — R112 Nausea with vomiting, unspecified: Secondary | ICD-10-CM

## 2022-12-17 LAB — CBC WITH DIFFERENTIAL/PLATELET
Abs Immature Granulocytes: 0.01 10*3/uL (ref 0.00–0.07)
Basophils Absolute: 0 10*3/uL (ref 0.0–0.1)
Basophils Relative: 1 %
Eosinophils Absolute: 0 10*3/uL (ref 0.0–0.5)
Eosinophils Relative: 0 %
HCT: 40.9 % (ref 36.0–46.0)
Hemoglobin: 13.2 g/dL (ref 12.0–15.0)
Immature Granulocytes: 0 %
Lymphocytes Relative: 43 %
Lymphs Abs: 1.8 10*3/uL (ref 0.7–4.0)
MCH: 28.8 pg (ref 26.0–34.0)
MCHC: 32.3 g/dL (ref 30.0–36.0)
MCV: 89.3 fL (ref 80.0–100.0)
Monocytes Absolute: 0.5 10*3/uL (ref 0.1–1.0)
Monocytes Relative: 12 %
Neutro Abs: 1.8 10*3/uL (ref 1.7–7.7)
Neutrophils Relative %: 44 %
Platelets: 207 10*3/uL (ref 150–400)
RBC: 4.58 MIL/uL (ref 3.87–5.11)
RDW: 13.4 % (ref 11.5–15.5)
WBC: 4.1 10*3/uL (ref 4.0–10.5)
nRBC: 0 % (ref 0.0–0.2)

## 2022-12-17 LAB — TROPONIN I (HIGH SENSITIVITY)
Troponin I (High Sensitivity): 6 ng/L (ref <18)
Troponin I (High Sensitivity): 7 ng/L (ref <18)

## 2022-12-17 LAB — COMPREHENSIVE METABOLIC PANEL
ALT: 16 U/L (ref 0–44)
AST: 22 U/L (ref 15–41)
Albumin: 3.6 g/dL (ref 3.5–5.0)
Alkaline Phosphatase: 72 U/L (ref 38–126)
Anion gap: 10 (ref 5–15)
BUN: 14 mg/dL (ref 8–23)
CO2: 28 mmol/L (ref 22–32)
Calcium: 7.3 mg/dL — ABNORMAL LOW (ref 8.9–10.3)
Chloride: 97 mmol/L — ABNORMAL LOW (ref 98–111)
Creatinine, Ser: 0.96 mg/dL (ref 0.44–1.00)
GFR, Estimated: >60 mL/min (ref >60)
Glucose, Bld: 108 mg/dL — ABNORMAL HIGH (ref 70–99)
Potassium: 3.1 mmol/L — ABNORMAL LOW (ref 3.5–5.1)
Sodium: 135 mmol/L (ref 135–145)
Total Bilirubin: 0.4 mg/dL (ref 0.3–1.2)
Total Protein: 7.2 g/dL (ref 6.5–8.1)

## 2022-12-17 LAB — RESP PANEL BY RT-PCR (RSV, FLU A&B, COVID)  RVPGX2
Influenza A by PCR: NEGATIVE
Influenza B by PCR: NEGATIVE
Resp Syncytial Virus by PCR: NEGATIVE
SARS Coronavirus 2 by RT PCR: POSITIVE — AB

## 2022-12-17 LAB — LIPASE, BLOOD: Lipase: 58 U/L — ABNORMAL HIGH (ref 11–51)

## 2022-12-17 MED ORDER — POTASSIUM CHLORIDE CRYS ER 20 MEQ PO TBCR
40.0000 meq | EXTENDED_RELEASE_TABLET | Freq: Once | ORAL | Status: AC
  Administered 2022-12-17: 40 meq via ORAL
  Filled 2022-12-17: qty 2

## 2022-12-17 MED ORDER — ONDANSETRON HCL 4 MG PO TABS: 4.0000 mg | ORAL_TABLET | Freq: Four times a day (QID) | ORAL | 0 refills | Status: AC

## 2022-12-17 MED ORDER — SODIUM CHLORIDE 0.9 % IV BOLUS: 1000.0000 mL | Freq: Once | INTRAVENOUS | Status: DC

## 2022-12-17 MED ORDER — ONDANSETRON HCL 4 MG/2ML IJ SOLN
4.0000 mg | Freq: Once | INTRAMUSCULAR | Status: AC
  Administered 2022-12-17: 4 mg via INTRAVENOUS
  Filled 2022-12-17: qty 2

## 2022-12-17 MED ORDER — POTASSIUM CHLORIDE CRYS ER 20 MEQ PO TBCR: 20.0000 meq | EXTENDED_RELEASE_TABLET | Freq: Every day | ORAL | 0 refills | Status: DC

## 2022-12-17 NOTE — ED Triage Notes (Addendum)
Pt unable to tolerate food x3 days. Emesis and body aches x3days.   Patient says she has intermitting chest pain x3 days also.

## 2022-12-17 NOTE — ED Provider Notes (Signed)
[facility] Provider Note   CSN: [id number] Arrival date & time: 12/17/22  0920     History  Chief Complaint  Patient presents with   Emesis   Chest Pain    Ann Reeves is a 67 y.o. female.   Emesis Associated symptoms: abdominal pain, chills, cough and myalgias   Associated symptoms: no diarrhea, no fever, no headaches and no sore throat   Chest Pain Associated symptoms: abdominal pain, cough, nausea, vomiting and weakness   Associated symptoms: no dizziness, no fever, no headache, no numbness and no shortness of breath         Ann Reeves is a 67 y.o. female with past medical history of hypertension, GERD, who presents to the Emergency Department complaining of generalized bodyaches, nausea, vomiting, and central chest pain.  Symptoms present for 4 days.  She states began after she attended a meeting with a large number of people present and she was not wearing a mask.  Later that evening, she noticed that she was having chills and bodyaches.  No known fever.  She has been having intermittent episodes of "tightness" across her upper chest.  Decreased appetite with vomiting when she attempts to eat solid foods.  She is tolerating small amounts of liquids.  Cramping pain of her lower abdomen that is intermittent.  Feels that she is dehydrated.  She has some mild nasal congestion and coughing.  She denies diarrhea, dysuria, and flank pain.   Home Medications Prior to Admission medications   Medication Sig Start Date End Date Taking? Authorizing Provider  albuterol (VENTOLIN HFA) 108 (90 Base) MCG/ACT inhaler INHALE 2 PUFFS BY MOUTH EVERY 6 HOURS AS NEEDED FOR WHEEZING OR SHORTNESS OF BREATH 11/20/22   Lindell Spar, MD  cholecalciferol (VITAMIN D) 25 MCG (1000 UNIT) tablet Take 1,000 Units by mouth daily.    [provider]  levothyroxine (SYNTHROID) 200 MCG tablet Take 1 tablet (200 mcg total) by mouth daily before  breakfast. 08/21/22   Lindell Spar, MD  RABEprazole (ACIPHEX) 20 MG tablet Take 1 tablet (20 mg total) by mouth 2 (two) times daily before a meal. 10/11/22 10/11/23  [name], DO      Allergies    Patient has no known allergies.    Review of Systems   Review of Systems  Constitutional:  Positive for appetite change and chills. Negative for fever.  HENT:  Positive for congestion. Negative for sore throat.   Respiratory:  Positive for cough. Negative for shortness of breath and wheezing.   Cardiovascular:  Positive for chest pain. Negative for leg swelling.  Gastrointestinal:  Positive for abdominal pain, nausea and vomiting. Negative for blood in stool and diarrhea.  Genitourinary:  Negative for dysuria and flank pain.  Musculoskeletal:  Positive for myalgias. Negative for neck pain and neck stiffness.  Skin:  Negative for rash.  Neurological:  Positive for weakness. Negative for dizziness, numbness and headaches.    Physical Exam Updated Vital Signs BP (!) 152/90   Pulse 65   Temp 97.8 F (36.6 C) (Oral)   Resp 16   Ht '5\' 6"'$  (1.676 m)   Wt 59 kg   SpO2 100%   BMI 20.98 kg/m  Physical Exam Vitals and nursing note reviewed.  Constitutional:      General: She is not in acute distress.    Appearance: She is well-developed. She is not ill-appearing or toxic-appearing.  HENT:     Nose:  Nose normal.     Mouth/Throat:     Mouth: Mucous membranes are dry.  Eyes:     General: No scleral icterus.    Extraocular Movements: Extraocular movements intact.     Conjunctiva/sclera: Conjunctivae normal.     Pupils: Pupils are equal, round, and reactive to light.  Cardiovascular:     Rate and Rhythm: Normal rate and regular rhythm.     Pulses: Normal pulses.  Pulmonary:     Effort: Pulmonary effort is normal. No respiratory distress.     Breath sounds: No wheezing.  Chest:     Chest wall: No tenderness.  Abdominal:     General: There is no distension.     Palpations:  Abdomen is soft.     Tenderness: There is no abdominal tenderness. There is no right CVA tenderness, left CVA tenderness or guarding.  Musculoskeletal:        General: Normal range of motion.     Right lower leg: No edema.     Left lower leg: No edema.  Skin:    General: Skin is warm.     Capillary Refill: Capillary refill takes less than 2 seconds.  Neurological:     General: No focal deficit present.     Mental Status: She is alert.     Sensory: No sensory deficit.     Motor: No weakness.     ED Results / Procedures / Treatments   Labs (all labs ordered are listed, but only abnormal results are displayed) Labs Reviewed  RESP PANEL BY RT-PCR (RSV, FLU A&B, COVID)  RVPGX2 - Abnormal; Notable for the following components:      Result Value   SARS Coronavirus 2 by RT PCR POSITIVE (*)    All other components within normal limits  COMPREHENSIVE METABOLIC PANEL - Abnormal; Notable for the following components:   Potassium 3.1 (*)    Chloride 97 (*)    Glucose, Bld 108 (*)    Calcium 7.3 (*)    All other components within normal limits  LIPASE, BLOOD - Abnormal; Notable for the following components:   Lipase 58 (*)    All other components within normal limits  CBC WITH DIFFERENTIAL/PLATELET  TROPONIN I (HIGH SENSITIVITY)  TROPONIN I (HIGH SENSITIVITY)    EKG EKG Interpretation  Date/Time:  Monday December 17 2022 09:45:47 EST Ventricular Rate:  65 PR Interval:  140 QRS Duration: 107 QT Interval:  455 QTC Calculation: 474 R Axis:   85 Text Interpretation: Sinus rhythm Consider right atrial enlargement Borderline right axis deviation Borderline abnrm T, anterolateral leads Confirmed by Milton Ferguson [phone]) on 12/17/2022 12:00:41 PM  Radiology No results found.  Procedures Procedures    Medications Ordered in ED Medications  sodium chloride 0.9 % bolus 1,000 mL (has no administration in time range)  ondansetron (ZOFRAN) injection 4 mg (has no administration in time  range)    ED Course/ Medical Decision Making/ A&P                             Medical Decision Making Patient here with symptoms of upper chest tightness, nausea, vomiting, congestion and a cough.  Symptoms present for 3 days.  Chest pain has been described as a tightness and has been intermittent since onset.  Some cough mostly nonproductive, no shortness of breath.  She has had some intermittent,.crampy lower abdominal pain as well  On my exam, patient well-appearing nontoxic.  Vital  signs reassuring.  No tachycardia tachypnea or hypoxia.  Her mucous membranes are slightly dry.  Overall, her abdomen is soft mostly non tender to palpation.  I suspect symptoms are related to viral process.  She does appear somewhat dehydrated, will check labs, urine and cardiac workup as well.  ACS, PE, pneumonia, bronchitis,  recurrence of her reflux all considered  Amount and/or Complexity of Data Reviewed Labs: ordered.    Details: Labs no leukocytosis, mild hypokalemia at 3.1 , felt to be secondary to her reported vomiting, lipase 58, trop unremarkable.  Delta not significantly changed.  Resp panel Covid positive.   Radiology: ordered. ECG/medicine tests: ordered.    Details: Sinus rhythm consider right atrial enlargement borderline right axis deviation Discussion of management or test interpretation with external provider(s): Patient given oral Zofran and potassium here.  On recheck reports feeling better.  Tolerating oral fluids without difficulty.  No further vomiting.  Discussed lab findings.  Vital signs reassuring.  Patient appears appropriate for discharge home at this time.  Will continue symptomatic treatment with Tylenol.  Prescription written for Zofran for nausea vomiting as well as rx for potassium. Given duration of her symptoms,I feel that antiviral treatment would be less helpful.  Will follow-up with PCP in 1 week to have potassium rechecked.  Risk Prescription drug  management.          Final Clinical Impression(s) / ED Diagnoses Final diagnoses:  Nausea and vomiting, unspecified vomiting type  COVID  Hypokalemia  Chest pain, unspecified type    Rx / DC Orders ED Discharge Orders          Ordered    ondansetron (ZOFRAN) 4 MG tablet  Every 6 hours        12/17/22 1326    potassium chloride SA (KLOR-CON M) 20 MEQ tablet  Daily        12/17/22 1326              Kem Parkinson, PA-C 12/18/22 3818    Milton Ferguson, MD 12/18/22 1050

## 2022-12-17 NOTE — Discharge Instructions (Addendum)
Your workup today shows that your potassium level was low.  You have been given potassium here as well as a prescription to take for the next several days.  You will need to have your potassium level rechecked by your primary care provider in 1 to 2 weeks.  Also, your COVID test today was positive.  I recommend that you rest, drink fluids, you may take Tylenol every 4 hours if needed for body aches and/or fever.  Please follow-up with your primary care provider or return to the emergency department for any new or worsening symptoms.

## 2022-12-18 ENCOUNTER — Other Ambulatory Visit: Payer: Self-pay | Admitting: Internal Medicine

## 2023-01-09 ENCOUNTER — Ambulatory Visit (INDEPENDENT_AMBULATORY_CARE_PROVIDER_SITE_OTHER): Payer: Medicare Other | Admitting: Internal Medicine

## 2023-01-09 ENCOUNTER — Encounter: Payer: Self-pay | Admitting: *Deleted

## 2023-01-09 ENCOUNTER — Encounter: Payer: Self-pay | Admitting: Internal Medicine

## 2023-01-09 VITALS — BP 121/71 | HR 68 | Ht 66.0 in | Wt 141.2 lb

## 2023-01-09 VITALS — BP 121/71 | HR 74 | Temp 97.7°F | Ht 66.0 in | Wt 141.9 lb

## 2023-01-09 DIAGNOSIS — Z09 Encounter for follow-up examination after completed treatment for conditions other than malignant neoplasm: Secondary | ICD-10-CM | POA: Diagnosis not present

## 2023-01-09 DIAGNOSIS — K2210 Ulcer of esophagus without bleeding: Secondary | ICD-10-CM

## 2023-01-09 DIAGNOSIS — Z1211 Encounter for screening for malignant neoplasm of colon: Secondary | ICD-10-CM

## 2023-01-09 DIAGNOSIS — R1013 Epigastric pain: Secondary | ICD-10-CM | POA: Diagnosis not present

## 2023-01-09 DIAGNOSIS — K219 Gastro-esophageal reflux disease without esophagitis: Secondary | ICD-10-CM

## 2023-01-09 DIAGNOSIS — E039 Hypothyroidism, unspecified: Secondary | ICD-10-CM | POA: Diagnosis not present

## 2023-01-09 MED ORDER — PEG 3350-KCL-NA BICARB-NACL 420 G PO SOLR: 4000.0000 mL | Freq: Once | ORAL | 0 refills | Status: AC

## 2023-01-09 MED ORDER — VOQUEZNA 20 MG PO TABS: 1.0000 | ORAL_TABLET | Freq: Every day | ORAL | 11 refills | Status: DC

## 2023-01-09 NOTE — Progress Notes (Signed)
Referring Provider: Lindell Spar, MD Primary Care Physician:  Lindell Spar, MD Primary GI:  Dr. Abbey Chatters  Chief Complaint  Patient presents with   Gastroesophageal Reflux    Follow up on GERD. Med switched at last visit. Doing some better with new med but still has some burning in chest. Concerns about weight loss without any change in diet. Would like to schedule colonoscopy.     HPI:   Ann Reeves is a 67 y.o. female who presents to clinic today for follow up visit.   Daily heartburn symptoms and burning in the epigastric area that radiates up into her chest. No real improvement after being treated for H. pylori.  Has been on Dexilant, omeprazole, pantoprazole, Nexium OTC. No nausea, vomiting, or dysphagia. Has cut out everything she likes. Losing weight because she is limiting her foods   RUQ [place] 1/19/2 normal.    HIDA 01/02/22 normal.   GES 07/25/22 WNL  Last EGD 02/27/21: Gastritis, H pylori positive.  Treated with abx, negative eradication (breath test) 10/2021.   Previously on Aleve but stopped this. Takes tylenol on occasion.   States she is due for screening colonoscopy.  Past Medical History:  Diagnosis Date   GERD (gastroesophageal reflux disease)    Helicobacter pylori gastritis 02/2021   Treated with Prevpac.  Documented eradication in December 2022.   Hypertension    Hyperthyroidism- had goiter removed in 1998    Mixed hyperlipidemia 08/27/2022    Past Surgical History:  Procedure Laterality Date   BIOPSY  02/27/2021   Procedure: BIOPSY;  Surgeon: [name], DO;  Location: [facility];  Service: Endoscopy;;  gastric   ESOPHAGOGASTRODUODENOSCOPY (EGD) WITH PROPOFOL N/A 02/27/2021   Surgeon: [name], DO; H. pylori gastritis.  Treated with Prevpac.   THYROID SURGERY      Current Outpatient Medications  Medication Sig Dispense Refill   albuterol (VENTOLIN HFA) 108 (90 Base) MCG/ACT inhaler INHALE 2 PUFFS BY MOUTH EVERY 6 HOURS AS  NEEDED FOR WHEEZING OR SHORTNESS OF BREATH 18 g 0   cholecalciferol (VITAMIN D) 25 MCG (1000 UNIT) tablet Take 1,000 Units by mouth daily.     levothyroxine (SYNTHROID) 200 MCG tablet Take 1 tablet (200 mcg total) by mouth daily before breakfast. 90 tablet 1   ondansetron (ZOFRAN) 4 MG tablet Take 1 tablet (4 mg total) by mouth every 6 (six) hours. As needed for nausea/vomiting 12 tablet 0   potassium chloride SA (KLOR-CON M) 20 MEQ tablet Take 1 tablet (20 mEq total) by mouth daily. 7 tablet 0   RABEprazole (ACIPHEX) 20 MG tablet Take 1 tablet (20 mg total) by mouth 2 (two) times daily before a meal. 180 tablet 3   No current facility-administered medications for this visit.    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[place]  Tobacco Use   Smoking status: Some Days    Packs/day: 0.50    Years: 10.00    Total pack years: 5.00    Types:  Cigarettes    Passive exposure: Current   Smokeless tobacco: Never  Vaping Use   Vaping Use: Never used  Substance and Sexual Activity   Alcohol use: Yes    Alcohol/week: 2.0 standard drinks of alcohol    Types: 2 Glasses of wine per week    Comment: occ   Drug use: Never   Sexual activity: Yes    Birth control/protection: Post-menopausal  Other Topics Concern   Not on file  Social History Narrative   Son lives in [place]; family lives in [place], moved to [place] at age 24   Social Determinants of Health   Financial Resource Strain: Low Risk  (08/14/2022)   Overall Financial Resource Strain (CARDIA)    Difficulty of Paying Living Expenses: Not hard at all  Food  Insecurity: No Food Insecurity (08/14/2022)   Hunger Vital Sign    Worried About Running Out of Food in the Last Year: Never true    Ran Out of Food in the Last Year: Never true  Transportation Needs: No Transportation Needs (08/14/2022)   PRAPARE - Hydrologist (Medical): No    Lack of Transportation (Non-Medical): No  Physical Activity: Inactive (08/14/2022)   Exercise Vital Sign    Days of Exercise per Week: 0 days    Minutes of Exercise per Session: 0 min  Stress: No Stress Concern Present (08/14/2022)   [place]    Feeling of Stress : Only a little  Social Connections: Unknown (08/14/2022)   Social Connection and Isolation Panel [NHANES]    Frequency of Communication with Friends and Family: More than three times a week    Frequency of Social Gatherings with Friends and Family: More than three times a week    Attends Religious Services: Not on Advertising copywriter or Organizations: No    Attends Archivist Meetings: Never    Marital Status: Married    Subjective: Review of Systems  Constitutional:  Negative for chills and fever.  HENT:  Negative for congestion and hearing loss.   Eyes:  Negative for blurred vision and double vision.  Respiratory:  Negative for cough and shortness of breath.   Cardiovascular:  Negative for chest pain and palpitations.  Gastrointestinal:  Positive for abdominal pain and heartburn. Negative for blood in stool, constipation, diarrhea, melena and vomiting.  Genitourinary:  Negative for dysuria and urgency.  Musculoskeletal:  Negative for joint pain and myalgias.  Skin:  Negative for itching and rash.  Neurological:  Negative for dizziness and headaches.  Psychiatric/Behavioral:  Negative for depression. The patient is not nervous/anxious.      Objective: BP 121/71 (BP Location: Left Arm, Patient Position: Sitting, Cuff Size: Normal)    Pulse 74   Temp 97.7 F (36.5 C) (Oral)   Ht 5' 6"$  (1.676 m)   Wt 141 lb 14.4 oz (64.4 kg)   BMI 22.90 kg/m  Physical Exam Constitutional:      Appearance: Normal appearance.  HENT:     Head: Normocephalic and atraumatic.  Eyes:     Extraocular Movements: Extraocular movements intact.     Conjunctiva/sclera: Conjunctivae normal.  Cardiovascular:     Rate and Rhythm: Normal rate and regular rhythm.  Pulmonary:     Effort: Pulmonary effort is normal.     Breath sounds: Normal breath sounds.  Abdominal:     General: Bowel sounds are normal.  Palpations: Abdomen is soft.  Musculoskeletal:        General: No swelling. Normal range of motion.     Cervical back: Normal range of motion and neck supple.  Skin:    General: Skin is warm and dry.     Coloration: Skin is not jaundiced.  Neurological:     General: No focal deficit present.     Mental Status: She is alert and oriented to person, place, and time.  Psychiatric:        Mood and Affect: Mood normal.        Behavior: Behavior normal.      Assessment: *Chronic GERD-not well-controlled *Epigastric pain/burning *History of H. pylori gastritis with negative eradication testing *Colon cancer screening  Plan: Patient has now trialed and failed Dexilant, pantoprazole, omeprazole, Nexium, rabeprazole.  Will trial her on Voquezna 20 mg daily.  Samples provided today.  May need to perform prior Auth.  Will schedule for screening colonoscopy.The risks including infection, bleed, or perforation as well as benefits, limitations, alternatives and imponderables have been reviewed with the patient. Questions have been answered. All parties agreeable.   01/09/2023 10:14 AM   Disclaimer: This note was dictated with voice recognition software. Similar sounding words can inadvertently be transcribed and may not be corrected upon review.

## 2023-01-09 NOTE — Progress Notes (Signed)
foll

## 2023-01-09 NOTE — Addendum Note (Signed)
Addended by: Cheron Every on: 01/09/2023 10:35 AM   Modules accepted: Orders

## 2023-01-09 NOTE — Assessment & Plan Note (Signed)
ER chart reviewed, including blood tests and EKG Had COVID infection, currently denies any cough, dyspnea or fever/chills Has chronic nausea and epigastric pain from GERD

## 2023-01-09 NOTE — Progress Notes (Signed)
Acute Office Visit  Subjective:    Patient ID: Ann Reeves, female    DOB: November 13, 1956, 67 y.o.   MRN: [id number]  Chief Complaint  Patient presents with   Follow-up    ER follow up from having covid    HPI Patient is in today for recent ER visit for nausea and vomiting on 12/17/22.  She was found to have COVID infection and was given symptomatic treatment in the ER.  She currently denies any fever, chills, cough, dyspnea or wheezing.  She also had chest/epigastric pain, but her EKG was negative for active ischemia.  Her cardiac markers were also negative.  GERD: She has tried on omeprazole, pantoprazole, Dexilant and rabeprazole without much relief.  She has been treated for H. pylori in the past.  She was seen by GI earlier today, and has been placed on Voquezna.  Hypothyroidism: Her TSH was low in 03/23, but her free T4 was WNL in the last visit.  She has been taking levothyroxine 200 mcg daily.  Denies any tremors or palpitations currently.  She has lost 6 lbs since the last visit, but states that it is due to diet change as she has cut down chocolates, caffeinated drinks and fried food.   Past Medical History:  Diagnosis Date   Encounter for examination following treatment at hospital 01/09/2023   GERD (gastroesophageal reflux disease)    Helicobacter pylori gastritis 02/2021   Treated with Prevpac.  Documented eradication in December 2022.   Hypertension    Hyperthyroidism- had goiter removed in 1998    Mixed hyperlipidemia 08/27/2022    Past Surgical History:  Procedure Laterality Date   BIOPSY  02/27/2021   Procedure: BIOPSY;  Surgeon: [name], DO;  [facility];  Service: Endoscopy;;  gastric   ESOPHAGOGASTRODUODENOSCOPY (EGD) WITH PROPOFOL N/A 02/27/2021   Surgeon: [name], DO; H. pylori gastritis.  Treated with Prevpac.   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 [place]  Tobacco Use   Smoking status: Some Days    Packs/day: 0.50    Years: 10.00    Total pack years: 5.00    Types: Cigarettes    Passive exposure: Current   Smokeless tobacco: Never  Vaping Use   Vaping Use: Never used  Substance and Sexual Activity   Alcohol use: Yes    Alcohol/week: 2.0 standard drinks of alcohol    Types: 2 Glasses of wine per week    Comment: occ   Drug use: Never   Sexual activity: Yes    Birth control/protection: Post-menopausal  Other Topics Concern   Not on file  Social History Narrative   Son lives in [place]; family lives in [place], moved to [place] at age 77   Social Determinants of Health   Financial Resource Strain: Low Risk  (08/14/2022)   Overall Financial Resource Strain (CARDIA)    Difficulty of Paying Living Expenses: Not hard at all  Food Insecurity: No Food Insecurity (08/14/2022)   Hunger Vital Sign    Worried About Running  Out of Food in the Last Year: Never true    Ran Out of Food in the Last Year: Never true  Transportation Needs: No Transportation Needs (08/14/2022)   PRAPARE - Hydrologist (Medical): No    Lack of Transportation (Non-Medical): No  Physical Activity: Inactive (08/14/2022)   Exercise Vital Sign    Days of Exercise per Week: 0 days    Minutes of Exercise per Session: 0 min  Stress: No Stress Concern Present (08/14/2022)   [place]    Feeling of Stress : Only a little  Social Connections: Unknown (08/14/2022)   Social Connection and Isolation Panel [NHANES]    Frequency of Communication with Friends and Family: More than three times a week     Frequency of Social Gatherings with Friends and Family: More than three times a week    Attends Religious Services: Not on file    Active Member of Clubs or Organizations: No    Attends Archivist Meetings: Never    Marital Status: Married  Human resources officer Violence: Not At Risk (08/18/2021)   Humiliation, Afraid, Rape, and Kick questionnaire    Fear of Current or Ex-Partner: No    Emotionally Abused: No    Physically Abused: No    Sexually Abused: No    Outpatient Medications Prior to Visit  Medication Sig Dispense Refill   albuterol (VENTOLIN HFA) 108 (90 Base) MCG/ACT inhaler INHALE 2 PUFFS BY MOUTH EVERY 6 HOURS AS NEEDED FOR WHEEZING OR SHORTNESS OF BREATH 18 g 0   cholecalciferol (VITAMIN D) 25 MCG (1000 UNIT) tablet Take 1,000 Units by mouth daily.     levothyroxine (SYNTHROID) 200 MCG tablet Take 1 tablet (200 mcg total) by mouth daily before breakfast. 90 tablet 1   ondansetron (ZOFRAN) 4 MG tablet Take 1 tablet (4 mg total) by mouth every 6 (six) hours. As needed for nausea/vomiting 12 tablet 0   polyethylene glycol-electrolytes (NULYTELY) 420 g solution Take 4,000 mLs by mouth once for 1 dose. 4000 mL 0   potassium chloride SA (KLOR-CON M) 20 MEQ tablet Take 1 tablet (20 mEq total) by mouth daily. 7 tablet 0   Vonoprazan Fumarate (VOQUEZNA) 20 MG TABS Take 1 tablet by mouth daily. 30 tablet 11   No facility-administered medications prior to visit.    No Known Allergies  Review of Systems  Constitutional:  Negative for chills and fever.  HENT:  Negative for congestion, sinus pressure, sinus pain and sore throat.   Eyes:  Negative for pain and discharge.  Respiratory:  Negative for cough and shortness of breath.   Cardiovascular:  Negative for chest pain and palpitations.  Gastrointestinal:  Positive for abdominal pain (Epigastric, burning). Negative for constipation, diarrhea, nausea and vomiting.  Endocrine: Negative for polydipsia and polyuria.  Genitourinary:   Negative for dysuria and hematuria.  Musculoskeletal:  Negative for neck pain and neck stiffness.  Skin:  Negative for rash.  Neurological:  Negative for dizziness and weakness.  Psychiatric/Behavioral:  Negative for agitation and behavioral problems.        Objective:    Physical Exam Vitals reviewed.  Constitutional:      General: She is not in acute distress.    Appearance: She is not diaphoretic.  HENT:     Head: Normocephalic and atraumatic.     Nose: Nose normal. No congestion.     Mouth/Throat:     Mouth: Mucous membranes are  moist.     Pharynx: No posterior oropharyngeal erythema.  Eyes:     General: No scleral icterus.    Extraocular Movements: Extraocular movements intact.  Neck:     Thyroid: No thyromegaly.  Cardiovascular:     Rate and Rhythm: Normal rate and regular rhythm.     Pulses: Normal pulses.     Heart sounds: Normal heart sounds. No murmur heard. Pulmonary:     Breath sounds: Normal breath sounds. No wheezing or rales.  Musculoskeletal:     Cervical back: Neck supple. No tenderness.     Right lower leg: No edema.     Left lower leg: No edema.  Skin:    General: Skin is warm.     Findings: No rash.  Neurological:     General: No focal deficit present.     Mental Status: She is alert and oriented to person, place, and time.  Psychiatric:        Mood and Affect: Mood normal.        Behavior: Behavior normal.     BP 121/71 (BP Location: Left Arm, Patient Position: Sitting, Cuff Size: Normal)   Pulse 68   Ht 5' 6"$  (1.676 m)   Wt 141 lb 3.2 oz (64 kg)   SpO2 98%   BMI 22.79 kg/m  Wt Readings from Last 3 Encounters:  01/09/23 141 lb 3.2 oz (64 kg)  01/09/23 141 lb 14.4 oz (64.4 kg)  12/17/22 130 lb (59 kg)        Assessment & Plan:   Problem List Items Addressed This Visit       Digestive   Gastroesophageal reflux disease    Has tried omeprazole, Dexilant and rabeprazole Needs to avoid NSAIDs, advised to take Tylenol or Voltaren  gel for arthritis Followed by Dr. Abbey Chatters - started Voquezna today        Endocrine   Acquired hypothyroidism    Lab Results  Component Value Date   TSH 0.100 (L) 02/01/2022   Needs TSH and free T4 testing On levothyroxine 200 mcg QD        Other   Encounter for examination following treatment at hospital - Primary    ER chart reviewed, including blood tests and EKG Had COVID infection, currently denies any cough, dyspnea or fever/chills Has chronic nausea and epigastric pain from GERD        No orders of the defined types were placed in this encounter.    Lindell Spar, MD

## 2023-01-09 NOTE — Patient Instructions (Signed)
We will schedule you for colonoscopy for colon cancer screening purposes.  For your chronic acid reflux and epigastric burning, I am going to trial you on a new medication called Voquezna.  Take this once daily in the morning.  Do not take your rabeprazole with this medication.  We may need to perform prior authorization for this medication as it was recently approved in the [place].  Follow-up after colonoscopy.  Is very nice seeing you again today.  Dr. Abbey Chatters

## 2023-01-09 NOTE — Assessment & Plan Note (Signed)
Has tried omeprazole, Dexilant and rabeprazole Needs to avoid NSAIDs, advised to take Tylenol or Voltaren gel for arthritis Followed by Dr. Abbey Chatters - started Voquezna today

## 2023-01-09 NOTE — Patient Instructions (Signed)
Please continue taking medications as prescribed.  Please continue to follow low salt diet and perform moderate exercise/walking at least 150 mins/week. 

## 2023-01-09 NOTE — Assessment & Plan Note (Signed)
Lab Results  Component Value Date   TSH 0.100 (L) 02/01/2022    Needs TSH and free T4 testing On levothyroxine 200 mcg QD

## 2023-01-09 NOTE — H&P (View-Only) (Signed)
Referring Provider: Lindell Spar, MD Primary Care Physician:  Lindell Spar, MD Primary GI:  Dr. Abbey Chatters  Chief Complaint  Patient presents with   Gastroesophageal Reflux    Follow up on GERD. Med switched at last visit. Doing some better with new med but still has some burning in chest. Concerns about weight loss without any change in diet. Would like to schedule colonoscopy.     HPI:   Ann Reeves is a 67 y.o. female who presents to clinic today for follow up visit.   Daily heartburn symptoms and burning in the epigastric area that radiates up into her chest. No real improvement after being treated for H. pylori.  Has been on Dexilant, omeprazole, pantoprazole, Nexium OTC. No nausea, vomiting, or dysphagia. Has cut out everything she likes. Losing weight because she is limiting her foods   RUQ [place] 1/19/2 normal.    HIDA 01/02/22 normal.   GES 07/25/22 WNL  Last EGD 02/27/21: Gastritis, H pylori positive.  Treated with abx, negative eradication (breath test) 10/2021.   Previously on Aleve but stopped this. Takes tylenol on occasion.   States she is due for screening colonoscopy.  Past Medical History:  Diagnosis Date   GERD (gastroesophageal reflux disease)    Helicobacter pylori gastritis 02/2021   Treated with Prevpac.  Documented eradication in December 2022.   Hypertension    Hyperthyroidism- had goiter removed in 1998    Mixed hyperlipidemia 08/27/2022    Past Surgical History:  Procedure Laterality Date   BIOPSY  02/27/2021   Procedure: BIOPSY;  Surgeon: [name], DO;  [facility];  Service: Endoscopy;;  gastric   ESOPHAGOGASTRODUODENOSCOPY (EGD) WITH PROPOFOL N/A 02/27/2021   Surgeon: [name], DO; H. pylori gastritis.  Treated with Prevpac.   THYROID SURGERY      Current Outpatient Medications  Medication Sig Dispense Refill   albuterol (VENTOLIN HFA) 108 (90 Base) MCG/ACT inhaler INHALE 2 PUFFS BY MOUTH EVERY 6 HOURS AS  NEEDED FOR WHEEZING OR SHORTNESS OF BREATH 18 g 0   cholecalciferol (VITAMIN D) 25 MCG (1000 UNIT) tablet Take 1,000 Units by mouth daily.     levothyroxine (SYNTHROID) 200 MCG tablet Take 1 tablet (200 mcg total) by mouth daily before breakfast. 90 tablet 1   ondansetron (ZOFRAN) 4 MG tablet Take 1 tablet (4 mg total) by mouth every 6 (six) hours. As needed for nausea/vomiting 12 tablet 0   potassium chloride SA (KLOR-CON M) 20 MEQ tablet Take 1 tablet (20 mEq total) by mouth daily. 7 tablet 0   RABEprazole (ACIPHEX) 20 MG tablet Take 1 tablet (20 mg total) by mouth 2 (two) times daily before a meal. 180 tablet 3   No current facility-administered medications for this visit.    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[place]  Tobacco Use   Smoking status: Some Days    Packs/day: 0.50    Years: 10.00    Total pack years: 5.00    Types:  Cigarettes    Passive exposure: Current   Smokeless tobacco: Never  Vaping Use   Vaping Use: Never used  Substance and Sexual Activity   Alcohol use: Yes    Alcohol/week: 2.0 standard drinks of alcohol    Types: 2 Glasses of wine per week    Comment: occ   Drug use: Never   Sexual activity: Yes    Birth control/protection: Post-menopausal  Other Topics Concern   Not on file  Social History Narrative   Son lives in [place]; family lives in [place], moved to [place] at age 73   Social Determinants of Health   Financial Resource Strain: Low Risk  (08/14/2022)   Overall Financial Resource Strain (CARDIA)    Difficulty of Paying Living Expenses: Not hard at all  Food  Insecurity: No Food Insecurity (08/14/2022)   Hunger Vital Sign    Worried About Running Out of Food in the Last Year: Never true    Ran Out of Food in the Last Year: Never true  Transportation Needs: No Transportation Needs (08/14/2022)   PRAPARE - Hydrologist (Medical): No    Lack of Transportation (Non-Medical): No  Physical Activity: Inactive (08/14/2022)   Exercise Vital Sign    Days of Exercise per Week: 0 days    Minutes of Exercise per Session: 0 min  Stress: No Stress Concern Present (08/14/2022)   [place]    Feeling of Stress : Only a little  Social Connections: Unknown (08/14/2022)   Social Connection and Isolation Panel [NHANES]    Frequency of Communication with Friends and Family: More than three times a week    Frequency of Social Gatherings with Friends and Family: More than three times a week    Attends Religious Services: Not on Advertising copywriter or Organizations: No    Attends Archivist Meetings: Never    Marital Status: Married    Subjective: Review of Systems  Constitutional:  Negative for chills and fever.  HENT:  Negative for congestion and hearing loss.   Eyes:  Negative for blurred vision and double vision.  Respiratory:  Negative for cough and shortness of breath.   Cardiovascular:  Negative for chest pain and palpitations.  Gastrointestinal:  Positive for abdominal pain and heartburn. Negative for blood in stool, constipation, diarrhea, melena and vomiting.  Genitourinary:  Negative for dysuria and urgency.  Musculoskeletal:  Negative for joint pain and myalgias.  Skin:  Negative for itching and rash.  Neurological:  Negative for dizziness and headaches.  Psychiatric/Behavioral:  Negative for depression. The patient is not nervous/anxious.      Objective: BP 121/71 (BP Location: Left Arm, Patient Position: Sitting, Cuff Size: Normal)    Pulse 74   Temp 97.7 F (36.5 C) (Oral)   Ht '5\' 6"'$  (1.676 m)   Wt 141 lb 14.4 oz (64.4 kg)   BMI 22.90 kg/m  Physical Exam Constitutional:      Appearance: Normal appearance.  HENT:     Head: Normocephalic and atraumatic.  Eyes:     Extraocular Movements: Extraocular movements intact.     Conjunctiva/sclera: Conjunctivae normal.  Cardiovascular:     Rate and Rhythm: Normal rate and regular rhythm.  Pulmonary:     Effort: Pulmonary effort is normal.     Breath sounds: Normal breath sounds.  Abdominal:     General: Bowel sounds are normal.  Palpations: Abdomen is soft.  Musculoskeletal:        General: No swelling. Normal range of motion.     Cervical back: Normal range of motion and neck supple.  Skin:    General: Skin is warm and dry.     Coloration: Skin is not jaundiced.  Neurological:     General: No focal deficit present.     Mental Status: She is alert and oriented to person, place, and time.  Psychiatric:        Mood and Affect: Mood normal.        Behavior: Behavior normal.      Assessment: *Chronic GERD-not well-controlled *Epigastric pain/burning *History of H. pylori gastritis with negative eradication testing *Colon cancer screening  Plan: Patient has now trialed and failed Dexilant, pantoprazole, omeprazole, Nexium, rabeprazole.  Will trial her on Voquezna 20 mg daily.  Samples provided today.  May need to perform prior Auth.  Will schedule for screening colonoscopy.The risks including infection, bleed, or perforation as well as benefits, limitations, alternatives and imponderables have been reviewed with the patient. Questions have been answered. All parties agreeable.   01/09/2023 10:14 AM   Disclaimer: This note was dictated with voice recognition software. Similar sounding words can inadvertently be transcribed and may not be corrected upon review.

## 2023-01-10 ENCOUNTER — Other Ambulatory Visit: Payer: Self-pay | Admitting: Internal Medicine

## 2023-01-10 DIAGNOSIS — E039 Hypothyroidism, unspecified: Secondary | ICD-10-CM

## 2023-01-10 LAB — LIPID PANEL
Chol/HDL Ratio: 3.5 ratio (ref 0.0–4.4)
Cholesterol, Total: 197 mg/dL (ref 100–199)
HDL: 57 mg/dL (ref >39)
LDL Chol Calc (NIH): 123 mg/dL — ABNORMAL HIGH (ref 0–99)
Triglycerides: 94 mg/dL (ref 0–149)
VLDL Cholesterol Cal: 17 mg/dL (ref 5–40)

## 2023-01-10 LAB — CMP14+EGFR
ALT: 12 IU/L (ref 0–32)
AST: 15 IU/L (ref 0–40)
Albumin/Globulin Ratio: 1.5 (ref 1.2–2.2)
Albumin: 4 g/dL (ref 3.9–4.9)
Alkaline Phosphatase: 93 IU/L (ref 44–121)
BUN/Creatinine Ratio: 14 (ref 12–28)
BUN: 12 mg/dL (ref 8–27)
Bilirubin Total: 0.2 mg/dL (ref 0.0–1.2)
CO2: 24 mmol/L (ref 20–29)
Calcium: 7.9 mg/dL — ABNORMAL LOW (ref 8.7–10.3)
Chloride: 104 mmol/L (ref 96–106)
Creatinine, Ser: 0.86 mg/dL (ref 0.57–1.00)
Globulin, Total: 2.7 g/dL (ref 1.5–4.5)
Glucose: 81 mg/dL (ref 70–99)
Potassium: 3.7 mmol/L (ref 3.5–5.2)
Sodium: 143 mmol/L (ref 134–144)
Total Protein: 6.7 g/dL (ref 6.0–8.5)
eGFR: 74 mL/min/{1.73_m2} (ref >59)

## 2023-01-10 LAB — TSH+FREE T4
Free T4: 2.15 ng/dL — ABNORMAL HIGH (ref 0.82–1.77)
TSH: 0.02 u[IU]/mL — ABNORMAL LOW (ref 0.450–4.500)

## 2023-01-10 MED ORDER — LEVOTHYROXINE SODIUM 175 MCG PO TABS: 175.0000 ug | ORAL_TABLET | Freq: Every day | ORAL | 1 refills | Status: DC

## 2023-01-17 ENCOUNTER — Encounter (HOSPITAL_COMMUNITY)
Admission: RE | Admit: 2023-01-17 | Discharge: 2023-01-17 | Disposition: A | Discharge status: Home / Self Care | Payer: Medicare Other | Source: Ambulatory Visit | Attending: Internal Medicine | Admitting: Internal Medicine

## 2023-01-17 ENCOUNTER — Encounter (HOSPITAL_COMMUNITY): Payer: Self-pay

## 2023-01-21 ENCOUNTER — Encounter (HOSPITAL_COMMUNITY): Payer: Self-pay

## 2023-01-21 ENCOUNTER — Ambulatory Visit (HOSPITAL_COMMUNITY)
Admission: RE | Admit: 2023-01-21 | Discharge: 2023-01-21 | Disposition: A | Discharge status: Home / Self Care | Payer: Medicare Other | Source: Ambulatory Visit | Attending: Internal Medicine | Admitting: Internal Medicine

## 2023-01-21 ENCOUNTER — Ambulatory Visit (HOSPITAL_BASED_OUTPATIENT_CLINIC_OR_DEPARTMENT_OTHER): Payer: Medicare Other | Admitting: Anesthesiology

## 2023-01-21 ENCOUNTER — Other Ambulatory Visit: Payer: Self-pay

## 2023-01-21 ENCOUNTER — Ambulatory Visit (HOSPITAL_COMMUNITY): Payer: Medicare Other | Admitting: Anesthesiology

## 2023-01-21 ENCOUNTER — Encounter (HOSPITAL_COMMUNITY)
Admission: RE | Disposition: A | Discharge status: Home / Self Care | Payer: Self-pay | Source: Ambulatory Visit | Attending: Internal Medicine

## 2023-01-21 DIAGNOSIS — Z79899 Other long term (current) drug therapy: Secondary | ICD-10-CM | POA: Insufficient documentation

## 2023-01-21 DIAGNOSIS — Z8619 Personal history of other infectious and parasitic diseases: Secondary | ICD-10-CM | POA: Diagnosis not present

## 2023-01-21 DIAGNOSIS — Z1211 Encounter for screening for malignant neoplasm of colon: Secondary | ICD-10-CM | POA: Diagnosis not present

## 2023-01-21 DIAGNOSIS — I10 Essential (primary) hypertension: Secondary | ICD-10-CM | POA: Insufficient documentation

## 2023-01-21 DIAGNOSIS — D123 Benign neoplasm of transverse colon: Secondary | ICD-10-CM | POA: Diagnosis not present

## 2023-01-21 DIAGNOSIS — E039 Hypothyroidism, unspecified: Secondary | ICD-10-CM | POA: Diagnosis not present

## 2023-01-21 DIAGNOSIS — K219 Gastro-esophageal reflux disease without esophagitis: Secondary | ICD-10-CM | POA: Diagnosis not present

## 2023-01-21 HISTORY — PX: COLONOSCOPY WITH PROPOFOL: SHX5780

## 2023-01-21 HISTORY — PX: POLYPECTOMY: SHX5525

## 2023-01-21 SURGERY — COLONOSCOPY WITH PROPOFOL
Anesthesia: General

## 2023-01-21 MED ORDER — LACTATED RINGERS IV SOLN: INTRAVENOUS | Status: DC

## 2023-01-21 MED ORDER — PROPOFOL 10 MG/ML IV BOLUS
INTRAVENOUS | Status: DC | PRN
  Administered 2023-01-21: 60 mg via INTRAVENOUS

## 2023-01-21 MED ORDER — PROPOFOL 500 MG/50ML IV EMUL
INTRAVENOUS | Status: DC | PRN
  Administered 2023-01-21: 150 ug/kg/min via INTRAVENOUS

## 2023-01-21 MED ORDER — LIDOCAINE HCL 1 % IJ SOLN
INTRAMUSCULAR | Status: DC | PRN
  Administered 2023-01-21: 50 mg via INTRADERMAL

## 2023-01-21 NOTE — Op Note (Signed)
[facility] Patient Name: Ann Reeves Procedure Date: 01/21/2023 8:03 AM MRN: [id number] Date of Birth: Apr 11, 1956 Attending MD: Elon Alas. Abbey Chatters , [place], [id number] CSN: [id number] Age: 67 Admit Type: Outpatient Procedure:                Colonoscopy Indications:              Screening for colorectal malignant neoplasm Providers:                Elon Alas. Abbey Chatters, DO, Tammy Vaught, RN, Crystal                            Page, Caprice Kluver Referring MD:              Medicines:                See the Anesthesia note for documentation of the                            administered medications Complications:            No immediate complications. Estimated Blood Loss:     Estimated blood loss was minimal. Procedure:                Pre-Anesthesia Assessment:                           - The anesthesia plan was to use monitored                            anesthesia care (MAC).                           After obtaining informed consent, the colonoscope                            was passed under direct vision. Throughout the                            procedure, the patient's blood pressure, pulse, and                            oxygen saturations were monitored continuously. The                            [id number]) scope was introduced through                            the anus and advanced to the the cecum, identified                            by appendiceal orifice and ileocecal valve. The                            colonoscopy was performed without difficulty. The                            patient tolerated the procedure  well. The quality                            of the bowel preparation was evaluated using the                            BBPS [facility] Bowel Preparation Scale) with scores                            of: Right Colon = 2 (minor amount of residual                            staining, small fragments of stool and/or opaque                            liquid,  but mucosa seen well), Transverse Colon = 2                            (minor amount of residual staining, small fragments                            of stool and/or opaque liquid, but mucosa seen                            well) and Left Colon = 2 (minor amount of residual                            staining, small fragments of stool and/or opaque                            liquid, but mucosa seen well). The total BBPS score                            equals 6. Fair. Scope In: 8:16:55 AM Scope Out: 8:39:09 AM Scope Withdrawal Time: 0 hours 19 minutes 31 seconds  Total Procedure Duration: 0 hours 22 minutes 14 seconds  Findings:      The perianal and digital rectal examinations were normal.      Three sessile polyps were found in the transverse colon. The polyps were       4 to 6 mm in size. These polyps were removed with a cold snare.       Resection and retrieval were complete.      A moderate amount of semi-liquid stool was found in the entire colon,       making visualization difficult. Lavage of the area was performed using       copious amounts of sterile water, resulting in clearance with fair       visualization.      The exam was otherwise without abnormality. Impression:               - Three 4 to 6 mm polyps in the transverse colon,                            removed with a cold snare.  Resected and retrieved.                           - Stool in the entire examined colon.                           - The examination was otherwise normal. Moderate Sedation:      Per Anesthesia Care Recommendation:           - Patient has a contact number available for                            emergencies. The signs and symptoms of potential                            delayed complications were discussed with the                            patient. Return to normal activities tomorrow.                            Written discharge instructions were provided to the                             patient.                           - Resume previous diet.                           - Continue present medications.                           - Await pathology results.                           - Repeat colonoscopy in 5 years for surveillance.                           - Return to GI clinic in 6 months. Procedure Code(s):        --- Professional ---                           [phone], Colonoscopy, flexible; with removal of                            tumor(s), polyp(s), or other lesion(s) by snare                            technique Diagnosis Code(s):        --- Professional ---                           Z12.11, Encounter for screening for malignant                            neoplasm of colon  D12.3, Benign neoplasm of transverse colon (hepatic                            flexure or splenic flexure) CPT copyright 2022 American Medical Association. All rights reserved. The codes documented in this report are preliminary and upon coder review may  be revised to meet current compliance requirements. Elon Alas. Abbey Chatters, DO Duncan Abbey Chatters, DO 01/21/2023 8:42:24 AM This report has been signed electronically. Number of Addenda: 0

## 2023-01-21 NOTE — Anesthesia Preprocedure Evaluation (Addendum)
Anesthesia Evaluation  Patient identified by MRN, date of birth, ID band Patient awake    Reviewed: Allergy & Precautions, H&P , NPO status , Patient's Chart, lab work & pertinent test results  Airway Mallampati: II  TM Distance: >3 FB Neck ROM: Full    Dental  (+) Dental Advisory Given, Chipped,    Pulmonary Current SmokerPatient did not abstain from smoking.   Pulmonary exam normal breath sounds clear to auscultation       Cardiovascular Exercise Tolerance: Good hypertension, Pt. on medications Normal cardiovascular exam Rhythm:Regular Rate:Normal     Neuro/Psych  Neuromuscular disease  negative psych ROS   GI/Hepatic Neg liver ROS,GERD  Medicated and Controlled,,  Endo/Other  Hypothyroidism    Renal/GU negative Renal ROS  negative genitourinary   Musculoskeletal  (+) Arthritis , Osteoarthritis,    Abdominal   Peds negative pediatric ROS (+)  Hematology negative hematology ROS (+)   Anesthesia Other Findings   Reproductive/Obstetrics negative OB ROS                             Anesthesia Physical Anesthesia Plan  ASA: 2  Anesthesia Plan: General   Post-op Pain Management:    Induction:   PONV Risk Score and Plan: Propofol infusion  Airway Management Planned: Nasal Cannula and Natural Airway  Additional Equipment:   Intra-op Plan:   Post-operative Plan:   Informed Consent: I have reviewed the patients History and Physical, chart, labs and discussed the procedure including the risks, benefits and alternatives for the proposed anesthesia with the patient or authorized representative who has indicated his/her understanding and acceptance.     Dental advisory given  Plan Discussed with: CRNA and Surgeon  Anesthesia Plan Comments:        Anesthesia Quick Evaluation

## 2023-01-21 NOTE — Anesthesia Postprocedure Evaluation (Signed)
Anesthesia Post Note  Patient: Ann Reeves  Procedure(s) Performed: COLONOSCOPY WITH PROPOFOL POLYPECTOMY  Patient location during evaluation: Short Stay Anesthesia Type: General Level of consciousness: awake and alert Pain management: pain level controlled Vital Signs Assessment: post-procedure vital signs reviewed and stable Respiratory status: spontaneous breathing Cardiovascular status: stable Postop Assessment: no apparent nausea or vomiting Anesthetic complications: no   No notable events documented.   Last Vitals:  Vitals:   01/21/23 0748  BP: (!) 140/91  Pulse: 67  Resp: 17  Temp: 36.6 C  SpO2: 98%    Last Pain:  Vitals:   01/21/23 0811  TempSrc:   PainSc: 0-No pain                 Bryce Cheever

## 2023-01-21 NOTE — Discharge Instructions (Addendum)
  Colonoscopy Discharge Instructions  Read the instructions outlined below and refer to this sheet in the next few weeks. These discharge instructions provide you with general information on caring for yourself after you leave the hospital. Your doctor may also give you specific instructions. While your treatment has been planned according to the most current medical practices available, unavoidable complications occasionally occur.   ACTIVITY You may resume your regular activity, but move at a slower pace for the next 24 hours.  Take frequent rest periods for the next 24 hours.  Walking will help get rid of the air and reduce the bloated feeling in your belly (abdomen).  No driving for 24 hours (because of the medicine (anesthesia) used during the test).   Do not sign any important legal documents or operate any machinery for 24 hours (because of the anesthesia used during the test).  NUTRITION Drink plenty of fluids.  You may resume your normal diet as instructed by your doctor.  Begin with a light meal and progress to your normal diet. Heavy or fried foods are harder to digest and may make you feel sick to your stomach (nauseated).  Avoid alcoholic beverages for 24 hours or as instructed.  MEDICATIONS You may resume your normal medications unless your doctor tells you otherwise.  WHAT YOU CAN EXPECT TODAY Some feelings of bloating in the abdomen.  Passage of more gas than usual.  Spotting of blood in your stool or on the toilet paper.  IF YOU HAD POLYPS REMOVED DURING THE COLONOSCOPY: No aspirin products for 7 days or as instructed.  No alcohol for 7 days or as instructed.  Eat a soft diet for the next 24 hours.  FINDING OUT THE RESULTS OF YOUR TEST Not all test results are available during your visit. If your test results are not back during the visit, make an appointment with your caregiver to find out the results. Do not assume everything is normal if you have not heard from your  caregiver or the medical facility. It is important for you to follow up on all of your test results.  SEEK IMMEDIATE MEDICAL ATTENTION IF: You have more than a spotting of blood in your stool.  Your belly is swollen (abdominal distention).  You are nauseated or vomiting.  You have a temperature over 101.  You have abdominal pain or discomfort that is severe or gets worse throughout the day.   Your colonoscopy revealed 3 polyp(s) which I removed successfully. Await pathology results, my office will contact you. I recommend repeating colonoscopy in 5 years for surveillance purposes. Follow up with GI in 6 months  [place]   I hope you have a great rest of your week!  Elon Alas. Abbey Chatters, D.O. Gastroenterology and Hepatology [facility]

## 2023-01-21 NOTE — Transfer of Care (Signed)
Immediate Anesthesia Transfer of Care Note  Patient: Ann Reeves  Procedure(s) Performed: COLONOSCOPY WITH PROPOFOL POLYPECTOMY  Patient Location: Short Stay  Anesthesia Type:General  Level of Consciousness: awake  Airway & Oxygen Therapy: Patient Spontanous Breathing  Post-op Assessment: Report given to RN  Post vital signs: Reviewed and stable  Last Vitals:  Vitals Value Taken Time  BP    Temp    Pulse    Resp    SpO2      Last Pain:  Vitals:   01/21/23 0811  TempSrc:   PainSc: 0-No pain      Patients Stated Pain Goal: 2 ([id number])  Complications: No notable events documented.

## 2023-01-21 NOTE — Interval H&P Note (Signed)
History and Physical Interval Note:  01/21/2023 7:57 AM  Ann Reeves  has presented today for surgery, with the diagnosis of screening.  The various methods of treatment have been discussed with the patient and family. After consideration of risks, benefits and other options for treatment, the patient has consented to  Procedure(s) with comments: COLONOSCOPY WITH PROPOFOL (N/A) - 900am, asa 2 as a surgical intervention.  The patient's history has been reviewed, patient examined, no change in status, stable for surgery.  I have reviewed the patient's chart and labs.  Questions were answered to the patient's satisfaction.     [name]

## 2023-01-22 LAB — SURGICAL PATHOLOGY

## 2023-01-29 ENCOUNTER — Encounter (HOSPITAL_COMMUNITY): Payer: Self-pay | Admitting: Internal Medicine

## 2023-02-26 ENCOUNTER — Ambulatory Visit (INDEPENDENT_AMBULATORY_CARE_PROVIDER_SITE_OTHER): Payer: Medicare Other | Admitting: Internal Medicine

## 2023-02-26 ENCOUNTER — Encounter: Payer: Self-pay | Admitting: Internal Medicine

## 2023-02-26 VITALS — BP 126/72 | HR 69 | Ht 66.0 in | Wt 144.6 lb

## 2023-02-26 DIAGNOSIS — E039 Hypothyroidism, unspecified: Secondary | ICD-10-CM | POA: Diagnosis not present

## 2023-02-26 DIAGNOSIS — M150 Primary generalized (osteo)arthritis: Secondary | ICD-10-CM

## 2023-02-26 DIAGNOSIS — E782 Mixed hyperlipidemia: Secondary | ICD-10-CM

## 2023-02-26 DIAGNOSIS — K219 Gastro-esophageal reflux disease without esophagitis: Secondary | ICD-10-CM

## 2023-02-26 DIAGNOSIS — M159 Polyosteoarthritis, unspecified: Secondary | ICD-10-CM | POA: Diagnosis not present

## 2023-02-26 NOTE — Assessment & Plan Note (Signed)
Has tried omeprazole, Dexilant and rabeprazole Needs to avoid NSAIDs, advised to take Tylenol or Voltaren gel for arthritis Followed by Dr. Abbey Chatters - started Voquezna recently

## 2023-02-26 NOTE — Assessment & Plan Note (Addendum)
Lab Results  Component Value Date   TSH 0.020 (L) 01/09/2023    Needs TSH and free T4 testing after 3 months Was on levothyroxine 200 mcg QD, decreased dose to 175 mg QD Weight loss likely due to oversupplemented thyroid

## 2023-02-26 NOTE — Assessment & Plan Note (Addendum)
Had not started treatment as her thyroid levels were abnormal in the past Rechecked lipid profile - improving

## 2023-02-26 NOTE — Patient Instructions (Addendum)
Please take Levothyroxine 175 mcg once daily.  Please continue taking medications as prescribed.  Please get blood tests done before the next visit.

## 2023-02-26 NOTE — Progress Notes (Signed)
Established Patient Office Visit  Subjective:  Patient ID: Ann Reeves, female    DOB: [birth year]  Age: 67 y.o. MRN: [id number]  CC:  Chief Complaint  Patient presents with   Hypothyroidism    Six month follow up    HPI Ann Reeves is a 67 y.o. female with past medical history of GERD, hypothyroidism and sciatica who presents for f/u of her chronic medical conditions.  GERD: She has been feeling better with Voquezna now.  She has been treated for H. pylori in the past.  She denies any dysphagia or odynophagia currently.  Hypothyroidism: Her TSH was low and her free T4 was elevated in 02/24.  She had been taking levothyroxine 200 mcg daily and has decreased dose to 175 mcg now as advised in 02/24.  Denies any tremors or palpitations currently.  She had lost 19 lbs in the last visit, but stated that it was also due to diet change as she has cut down chocolates, caffeinated drinks and fried food. Her weight has been stable since the last visit.  Past Medical History:  Diagnosis Date   GERD (gastroesophageal reflux disease)    Helicobacter pylori gastritis 02/2021   Treated with Prevpac.  Documented eradication in December 2022.   Hypertension    Hyperthyroidism- had goiter removed in 1998    Mixed hyperlipidemia 08/27/2022    Past Surgical History:  Procedure Laterality Date   BIOPSY  02/27/2021   Procedure: BIOPSY;  Surgeon: Lanelle Bal, DO;  Location: [facility];  Service: Endoscopy;;  gastric   COLONOSCOPY WITH PROPOFOL N/A 01/21/2023   Procedure: COLONOSCOPY WITH PROPOFOL;  Surgeon: Lanelle Bal, DO;  Location: [facility];  Service: Endoscopy;  Laterality: N/A;  900am, asa 2   ESOPHAGOGASTRODUODENOSCOPY (EGD) WITH PROPOFOL N/A 02/27/2021   Surgeon: Lanelle Bal, DO; H. pylori gastritis.  Treated with Prevpac.   POLYPECTOMY  01/21/2023   Procedure: POLYPECTOMY;  Surgeon: Lanelle Bal, DO;  Location: [facility];  Service: Endoscopy;;   THYROID  SURGERY      Family History  Problem Relation Age of Onset   Cancer Maternal Aunt    Kidney disease Sister     Social History   Socioeconomic History   Marital status: Married    Spouse name: Not on file   Number of children: 1   Years of education: Not on file   Highest education level: Not on file  Occupational History   Occupation: Retired- Librarian, academic- at Social worker firm in [place]  Tobacco Use   Smoking status: Some Days    Packs/day: 0.50    Years: 10.00    Additional pack years: 0.00    Total pack years: 5.00    Types: Cigarettes    Passive exposure: Current   Smokeless tobacco: Never  Vaping Use   Vaping Use: Never used  Substance and Sexual Activity   Alcohol use: Yes    Alcohol/week: 2.0 standard drinks of alcohol    Types: 2 Glasses of wine per week    Comment: occ   Drug use: Never   Sexual activity: Yes    Birth control/protection: Post-menopausal  Other Topics Concern   Not on file  Social History Narrative   Son lives in [place]; family lives in [place], moved to [place] at age 64   Social Determinants of Health   Financial Resource Strain: Low Risk  (08/14/2022)   Overall Financial Resource Strain (CARDIA)    Difficulty of Paying Living  Expenses: Not hard at all  Food Insecurity: No Food Insecurity (08/14/2022)   Hunger Vital Sign    Worried About Running Out of Food in the Last Year: Never true    Ran Out of Food in the Last Year: Never true  Transportation Needs: No Transportation Needs (08/14/2022)   PRAPARE - Administrator, Civil Service (Medical): No    Lack of Transportation (Non-Medical): No  Physical Activity: Inactive (08/14/2022)   Exercise Vital Sign    Days of Exercise per Week: 0 days    Minutes of Exercise per Session: 0 min  Stress: No Stress Concern Present (08/14/2022)   [employer] of Occupational Health - Occupational Stress Questionnaire    Feeling of Stress : Only a little  Social Connections: Unknown (08/14/2022)    Social Connection and Isolation Panel [NHANES]    Frequency of Communication with Friends and Family: More than three times a week    Frequency of Social Gatherings with Friends and Family: More than three times a week    Attends Religious Services: Not on file    Active Member of Clubs or Organizations: No    Attends Banker Meetings: Never    Marital Status: Married  Catering manager Violence: Not At Risk (08/18/2021)   Humiliation, Afraid, Rape, and Kick questionnaire    Fear of Current or Ex-Partner: No    Emotionally Abused: No    Physically Abused: No    Sexually Abused: No    Outpatient Medications Prior to Visit  Medication Sig Dispense Refill   acetaminophen (TYLENOL) 500 MG tablet Take 1,000 mg by mouth every 6 (six) hours as needed for moderate pain.     albuterol (VENTOLIN HFA) 108 (90 Base) MCG/ACT inhaler INHALE 2 PUFFS BY MOUTH EVERY 6 HOURS AS NEEDED FOR WHEEZING OR SHORTNESS OF BREATH 18 g 0   calcium carbonate (TUMS - DOSED IN MG ELEMENTAL CALCIUM) 500 MG chewable tablet Chew 2 tablets by mouth daily as needed for indigestion or heartburn.     cholecalciferol (VITAMIN D) 25 MCG (1000 UNIT) tablet Take 2,000 Units by mouth daily.     levothyroxine (SYNTHROID) 175 MCG tablet Take 1 tablet (175 mcg total) by mouth daily before breakfast. 90 tablet 1   ondansetron (ZOFRAN) 4 MG tablet Take 1 tablet (4 mg total) by mouth every 6 (six) hours. As needed for nausea/vomiting 12 tablet 0   potassium chloride SA (KLOR-CON M) 20 MEQ tablet Take 1 tablet (20 mEq total) by mouth daily. 7 tablet 0   Vonoprazan Fumarate (VOQUEZNA) 20 MG TABS Take 1 tablet by mouth daily. 30 tablet 11   No facility-administered medications prior to visit.    No Known Allergies  ROS Review of Systems  Constitutional:  Positive for unexpected weight change. Negative for chills and fever.  HENT:  Negative for congestion, sinus pressure, sinus pain and sore throat.   Eyes:  Negative for  pain and discharge.  Respiratory:  Negative for cough and shortness of breath.   Cardiovascular:  Negative for chest pain and palpitations.  Gastrointestinal:  Positive for abdominal pain (Epigastric, burning). Negative for constipation, diarrhea, nausea and vomiting.  Endocrine: Negative for polydipsia and polyuria.  Genitourinary:  Negative for dysuria and hematuria.  Musculoskeletal:  Negative for neck pain and neck stiffness.  Skin:  Negative for rash.  Neurological:  Negative for dizziness and weakness.  Psychiatric/Behavioral:  Negative for agitation and behavioral problems.       Objective:  Physical Exam Vitals reviewed.  Constitutional:      General: She is not in acute distress.    Appearance: She is not diaphoretic.  HENT:     Head: Normocephalic and atraumatic.     Nose: Nose normal. No congestion.     Mouth/Throat:     Mouth: Mucous membranes are moist.     Pharynx: No posterior oropharyngeal erythema.  Eyes:     General: No scleral icterus.    Extraocular Movements: Extraocular movements intact.  Neck:     Thyroid: No thyromegaly.  Cardiovascular:     Rate and Rhythm: Normal rate and regular rhythm.     Pulses: Normal pulses.     Heart sounds: Normal heart sounds. No murmur heard. Pulmonary:     Breath sounds: Normal breath sounds. No wheezing or rales.  Musculoskeletal:     Cervical back: Neck supple. No tenderness.     Right lower leg: No edema.     Left lower leg: No edema.  Skin:    General: Skin is warm.     Findings: No rash.  Neurological:     General: No focal deficit present.     Mental Status: She is alert and oriented to person, place, and time.  Psychiatric:        Mood and Affect: Mood normal.        Behavior: Behavior normal.     BP 126/72 (BP Location: Right Arm, Patient Position: Sitting, Cuff Size: Normal)   Pulse 69   Ht 5\' 6"  (1.676 m)   Wt 144 lb 9.6 oz (65.6 kg)   SpO2 98%   BMI 23.34 kg/m  Wt Readings from Last 3  Encounters:  02/26/23 144 lb 9.6 oz (65.6 kg)  01/09/23 141 lb 3.2 oz (64 kg)  01/09/23 141 lb 14.4 oz (64.4 kg)    Lab Results  Component Value Date   TSH 0.020 (L) 01/09/2023   Lab Results  Component Value Date   WBC 4.1 12/17/2022   HGB 13.2 12/17/2022   HCT 40.9 12/17/2022   MCV 89.3 12/17/2022   PLT 207 12/17/2022   Lab Results  Component Value Date   NA 143 01/09/2023   K 3.7 01/09/2023   CO2 24 01/09/2023   GLUCOSE 81 01/09/2023   BUN 12 01/09/2023   CREATININE 0.86 01/09/2023   BILITOT 0.2 01/09/2023   ALKPHOS 93 01/09/2023   AST 15 01/09/2023   ALT 12 01/09/2023   PROT 6.7 01/09/2023   ALBUMIN 4.0 01/09/2023   CALCIUM 7.9 (L) 01/09/2023   ANIONGAP 10 12/17/2022   EGFR 74 01/09/2023   Lab Results  Component Value Date   CHOL 197 01/09/2023   Lab Results  Component Value Date   HDL 57 01/09/2023   Lab Results  Component Value Date   LDLCALC 123 (H) 01/09/2023   Lab Results  Component Value Date   TRIG 94 01/09/2023   Lab Results  Component Value Date   CHOLHDL 3.5 01/09/2023   No results found for: "HGBA1C"    Assessment & Plan:   Problem List Items Addressed This Visit       Digestive   Gastroesophageal reflux disease    Has tried omeprazole, Dexilant and rabeprazole Needs to avoid NSAIDs, advised to take Tylenol or Voltaren gel for arthritis Followed by Dr. Marletta Lor - started Voquezna recently        Endocrine   Acquired hypothyroidism - Primary    Lab Results  Component Value Date  TSH 0.020 (L) 01/09/2023   Needs TSH and free T4 testing after 3 months Was on levothyroxine 200 mcg QD, decreased dose to 175 mg QD Weight loss likely due to oversupplemented thyroid      Relevant Orders   TSH + free T4   CMP14+EGFR     Musculoskeletal and Integument   Primary osteoarthritis    Of hands and knee Needs to avoid oral NSAIDs as she has GERD Advised to take Tylenol instead Voltaren gel PRN        Other   Mixed  hyperlipidemia    Had not started treatment as her thyroid levels were abnormal in the past Rechecked lipid profile - improving      No orders of the defined types were placed in this encounter.   Follow-up: Return in about 3 months (around 05/28/2023) for Hypothyroidism.    Anabel Halon, MD

## 2023-02-26 NOTE — Assessment & Plan Note (Signed)
Of hands and knee ?Needs to avoid oral NSAIDs as she has GERD ?Advised to take Tylenol instead ?Voltaren gel PRN ?

## 2023-03-01 ENCOUNTER — Encounter: Payer: Self-pay | Admitting: Internal Medicine

## 2023-05-28 ENCOUNTER — Ambulatory Visit: Payer: Medicare Other | Admitting: Internal Medicine

## 2023-06-05 ENCOUNTER — Ambulatory Visit (INDEPENDENT_AMBULATORY_CARE_PROVIDER_SITE_OTHER): Payer: Medicare Other | Admitting: Internal Medicine

## 2023-06-05 VITALS — BP 148/78 | HR 65 | Ht 66.0 in | Wt 139.0 lb

## 2023-06-05 DIAGNOSIS — K219 Gastro-esophageal reflux disease without esophagitis: Secondary | ICD-10-CM | POA: Diagnosis not present

## 2023-06-05 DIAGNOSIS — I10 Essential (primary) hypertension: Secondary | ICD-10-CM

## 2023-06-05 DIAGNOSIS — E039 Hypothyroidism, unspecified: Secondary | ICD-10-CM | POA: Diagnosis not present

## 2023-06-05 DIAGNOSIS — E782 Mixed hyperlipidemia: Secondary | ICD-10-CM | POA: Diagnosis not present

## 2023-06-05 NOTE — Patient Instructions (Signed)
Please continue to take medications as prescribed.  Please continue to follow DASH diet and perform moderate exercise/walking at least 150 mins/week. 

## 2023-06-06 ENCOUNTER — Other Ambulatory Visit: Payer: Self-pay | Admitting: Internal Medicine

## 2023-06-06 DIAGNOSIS — E039 Hypothyroidism, unspecified: Secondary | ICD-10-CM

## 2023-06-06 LAB — CMP14+EGFR
ALT: 12 IU/L (ref 0–32)
AST: 17 IU/L (ref 0–40)
Albumin: 4.1 g/dL (ref 3.9–4.9)
Alkaline Phosphatase: 94 IU/L (ref 44–121)
BUN/Creatinine Ratio: 10 — ABNORMAL LOW (ref 12–28)
BUN: 9 mg/dL (ref 8–27)
Bilirubin Total: 0.4 mg/dL (ref 0.0–1.2)
CO2: 25 mmol/L (ref 20–29)
Calcium: 8.3 mg/dL — ABNORMAL LOW (ref 8.7–10.3)
Chloride: 102 mmol/L (ref 96–106)
Creatinine, Ser: 0.87 mg/dL (ref 0.57–1.00)
Globulin, Total: 3 g/dL (ref 1.5–4.5)
Glucose: 65 mg/dL — ABNORMAL LOW (ref 70–99)
Potassium: 3.5 mmol/L (ref 3.5–5.2)
Sodium: 143 mmol/L (ref 134–144)
Total Protein: 7.1 g/dL (ref 6.0–8.5)
eGFR: 73 mL/min/{1.73_m2} (ref >59)

## 2023-06-06 LAB — TSH+T4F+T3FREE
Free T4: 1.81 ng/dL — ABNORMAL HIGH (ref 0.82–1.77)
T3, Free: 2.6 pg/mL (ref 2.0–4.4)
TSH: 0.149 u[IU]/mL — ABNORMAL LOW (ref 0.450–4.500)

## 2023-06-06 MED ORDER — LEVOTHYROXINE SODIUM 150 MCG PO TABS: 150.0000 ug | ORAL_TABLET | Freq: Every day | ORAL | 3 refills | Status: DC

## 2023-06-07 DIAGNOSIS — I10 Essential (primary) hypertension: Secondary | ICD-10-CM | POA: Insufficient documentation

## 2023-06-07 NOTE — Assessment & Plan Note (Signed)
Lab Results  Component Value Date   TSH 0.149 (L) 06/05/2023    Needs TSH and free T4 testing after 3 months Was on levothyroxine 175 mcg QD, decreased dose to 150 mg QD Weight loss likely due to oversupplemented thyroid

## 2023-06-07 NOTE — Assessment & Plan Note (Signed)
BP Readings from Last 1 Encounters:  06/05/23 (!) 148/78   New onset Could be due to oversupplemented thyroid as well Advised DASH diet and moderate exercise/walking, at least 150 mins/week If  persistently elevated, will add amlodipine

## 2023-06-07 NOTE — Progress Notes (Signed)
Established Patient Office Visit  Subjective:  Patient ID: Ann Reeves, female    DOB: 1956-03-24  Age: 67 y.o. MRN: [id number]  CC:  Chief Complaint  Patient presents with   Hypothyroidism    Follow up   Heartburn    Follow up    HPI Ann Reeves is a 67 y.o. female with past medical history of GERD, hypothyroidism and sciatica who presents for f/u of her chronic medical conditions.  GERD: She has been feeling better with Voquezna now.  She has been treated for H. pylori in the past.  She denies any dysphagia or odynophagia currently.  Hypothyroidism: Her TSH was low and her free T4 was elevated in 02/24.  She had been taking levothyroxine 200 mcg daily and has decreased dose to 175 mcg now as advised in 02/24.  Denies any tremors or palpitations currently.  She has lost 5 lbs since the last visit.  HTN: Her BP was  elevated today upon multiple measurements.  She denies any headache, dizziness, chest pain, dyspnea or palpitations currently.  Past Medical History:  Diagnosis Date   GERD (gastroesophageal reflux disease)    Helicobacter pylori gastritis 02/2021   Treated with Prevpac.  Documented eradication in December 2022.   Hypertension    Hyperthyroidism- had goiter removed in 1998    Mixed hyperlipidemia 08/27/2022    Past Surgical History:  Procedure Laterality Date   BIOPSY  02/27/2021   Procedure: BIOPSY;  Surgeon: Lanelle Bal, DO;  Location: [facility];  Service: Endoscopy;;  gastric   COLONOSCOPY WITH PROPOFOL N/A 01/21/2023   Procedure: COLONOSCOPY WITH PROPOFOL;  Surgeon: Lanelle Bal, DO;  Location: [facility];  Service: Endoscopy;  Laterality: N/A;  900am, asa 2   ESOPHAGOGASTRODUODENOSCOPY (EGD) WITH PROPOFOL N/A 02/27/2021   Surgeon: Lanelle Bal, DO; H. pylori gastritis.  Treated with Prevpac.   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 [place]  Tobacco Use   Smoking status: Some Days    Current packs/day: 0.50    Average packs/day: 0.5 packs/day for 10.0 years (5.0 ttl pk-yrs)    Types: Cigarettes    Passive exposure: Current   Smokeless tobacco: Never  Vaping Use   Vaping status: Never Used  Substance and Sexual Activity   Alcohol use: Yes    Alcohol/week: 2.0 standard drinks of alcohol    Types: 2 Glasses of wine per week    Comment: occ   Drug use: Never   Sexual activity: Yes    Birth control/protection: Post-menopausal  Other Topics Concern   Not on file  Social History Narrative   Son lives in [place]; family lives in [place], moved to [place] at age 54   Social Determinants of Health   Financial Resource Strain: Low Risk  (08/14/2022)   Overall Financial Resource Strain (CARDIA)    Difficulty of Paying Living Expenses: Not hard at all  Food  Insecurity: No Food Insecurity (06/05/2023)   Hunger Vital Sign    Worried About Running Out of Food in the Last Year: Never true    Ran Out of Food in the Last Year: Never true  Transportation Needs: No Transportation Needs (06/05/2023)   PRAPARE - Administrator, Civil Service (Medical): No    Lack of Transportation (Non-Medical): No  Physical Activity: Insufficiently Active (06/05/2023)   Exercise Vital Sign    Days of Exercise per Week: 3 days    Minutes of Exercise per Session: 30 min  Stress: No Stress Concern Present (06/05/2023)   [employer] of Occupational Health - Occupational Stress Questionnaire    Feeling of Stress : Only a little  Social  Connections: Moderately Integrated (06/05/2023)   Social Connection and Isolation Panel [NHANES]    Frequency of Communication with Friends and Family: More than three times a week    Frequency of Social Gatherings with Friends and Family: More than three times a week    Attends Religious Services: More than 4 times per year    Active Member of [employer] or Organizations: No    Attends Banker Meetings: Never    Marital Status: Married  Catering manager Violence: Not At Risk (08/18/2021)   Humiliation, Afraid, Rape, and Kick questionnaire    Fear of Current or Ex-Partner: No    Emotionally Abused: No    Physically Abused: No    Sexually Abused: No    Outpatient Medications Prior to Visit  Medication Sig Dispense Refill   acetaminophen (TYLENOL) 500 MG tablet Take 1,000 mg by mouth every 6 (six) hours as needed for moderate pain.     albuterol (VENTOLIN HFA) 108 (90 Base) MCG/ACT inhaler INHALE 2 PUFFS BY MOUTH EVERY 6 HOURS AS NEEDED FOR WHEEZING OR SHORTNESS OF BREATH 18 g 0   calcium carbonate (TUMS - DOSED IN MG ELEMENTAL CALCIUM) 500 MG chewable tablet Chew 2 tablets by mouth daily as needed for indigestion or heartburn.     cholecalciferol (VITAMIN D) 25 MCG (1000 UNIT) tablet Take 2,000 Units by mouth daily.     ondansetron (ZOFRAN) 4 MG tablet Take 1 tablet (4 mg total) by mouth every 6 (six) hours. As needed for nausea/vomiting 12 tablet 0   Vonoprazan Fumarate (VOQUEZNA) 20 MG TABS Take 1 tablet by mouth daily. 30 tablet 11   levothyroxine (SYNTHROID) 175 MCG tablet Take 1 tablet (175 mcg total) by mouth daily before breakfast. 90 tablet 1   potassium chloride SA (KLOR-CON M) 20 MEQ tablet Take 1 tablet (20 mEq total) by mouth daily. 7 tablet 0   No facility-administered medications prior to visit.    No Known Allergies  ROS Review of Systems  Constitutional:  Positive for unexpected weight change. Negative for chills and fever.  HENT:  Negative for congestion,  sinus pressure, sinus pain and sore throat.   Eyes:  Negative for pain and discharge.  Respiratory:  Negative for cough and shortness of breath.   Cardiovascular:  Negative for chest pain and palpitations.  Gastrointestinal:  Positive for abdominal pain (Epigastric, burning). Negative for constipation, diarrhea, nausea and vomiting.  Endocrine: Negative for polydipsia and polyuria.  Genitourinary:  Negative for dysuria and hematuria.  Musculoskeletal:  Negative for neck pain and neck stiffness.  Skin:  Negative for rash.  Neurological:  Negative for dizziness and weakness.  Psychiatric/Behavioral:  Negative for agitation and behavioral problems.       Objective:  Physical Exam Vitals reviewed.  Constitutional:      General: She is not in acute distress.    Appearance: She is not diaphoretic.  HENT:     Head: Normocephalic and atraumatic.     Nose: Nose normal. No congestion.     Mouth/Throat:     Mouth: Mucous membranes are moist.     Pharynx: No posterior oropharyngeal erythema.  Eyes:     General: No scleral icterus.    Extraocular Movements: Extraocular movements intact.  Neck:     Thyroid: No thyromegaly.  Cardiovascular:     Rate and Rhythm: Normal rate and regular rhythm.     Pulses: Normal pulses.     Heart sounds: Normal heart sounds. No murmur heard. Pulmonary:     Breath sounds: Normal breath sounds. No wheezing or rales.  Musculoskeletal:     Cervical back: Neck supple. No tenderness.     Right lower leg: No edema.     Left lower leg: No edema.  Skin:    General: Skin is warm.     Findings: No rash.  Neurological:     General: No focal deficit present.     Mental Status: She is alert and oriented to person, place, and time.  Psychiatric:        Mood and Affect: Mood normal.        Behavior: Behavior normal.     BP (!) 148/78 (BP Location: Right Arm)   Pulse 65   Ht 5\' 6"  (1.676 m)   Wt 139 lb (63 kg)   SpO2 98%   BMI 22.44 kg/m  Wt Readings  from Last 3 Encounters:  06/05/23 139 lb (63 kg)  02/26/23 144 lb 9.6 oz (65.6 kg)  01/09/23 141 lb 3.2 oz (64 kg)    Lab Results  Component Value Date   TSH 0.149 (L) 06/05/2023   Lab Results  Component Value Date   WBC 4.1 12/17/2022   HGB 13.2 12/17/2022   HCT 40.9 12/17/2022   MCV 89.3 12/17/2022   PLT 207 12/17/2022   Lab Results  Component Value Date   NA 143 06/05/2023   K 3.5 06/05/2023   CO2 25 06/05/2023   GLUCOSE 65 (L) 06/05/2023   BUN 9 06/05/2023   CREATININE 0.87 06/05/2023   BILITOT 0.4 06/05/2023   ALKPHOS 94 06/05/2023   AST 17 06/05/2023   ALT 12 06/05/2023   PROT 7.1 06/05/2023   ALBUMIN 4.1 06/05/2023   CALCIUM 8.3 (L) 06/05/2023   ANIONGAP 10 12/17/2022   EGFR 73 06/05/2023   Lab Results  Component Value Date   CHOL 197 01/09/2023   Lab Results  Component Value Date   HDL 57 01/09/2023   Lab Results  Component Value Date   LDLCALC 123 (H) 01/09/2023   Lab Results  Component Value Date   TRIG 94 01/09/2023   Lab Results  Component Value Date   CHOLHDL 3.5 01/09/2023   No results found for: "HGBA1C"    Assessment & Plan:   Problem List Items Addressed This Visit       Cardiovascular and Mediastinum   Essential hypertension    BP Readings from Last 1 Encounters:  06/05/23 (!) 148/78   New onset Could be due to oversupplemented thyroid as well Advised DASH diet and moderate exercise/walking, at least 150 mins/week If  persistently elevated, will add amlodipine         Digestive   Gastroesophageal reflux disease    Has  tried omeprazole, Dexilant and rabeprazole Needs to avoid NSAIDs, advised to take Tylenol or Voltaren gel for arthritis Followed by Dr. Marletta Lor - on Voquezna        Endocrine   Acquired hypothyroidism - Primary    Lab Results  Component Value Date   TSH 0.149 (L) 06/05/2023    Needs TSH and free T4 testing after 3 months Was on levothyroxine 175 mcg QD, decreased dose to 150 mg QD Weight loss  likely due to oversupplemented thyroid      Relevant Orders   TSH+T4F+T3Free (Completed)   CMP14+EGFR (Completed)     Other   Mixed hyperlipidemia    Had not started treatment as her thyroid levels were abnormal in the past Rechecked lipid profile - improving      No orders of the defined types were placed in this encounter.   Follow-up: Return in about 3 months (around 09/05/2023) for HTN and hypothyroidism.    Anabel Halon, MD

## 2023-06-07 NOTE — Assessment & Plan Note (Signed)
Had not started treatment as her thyroid levels were abnormal in the past Rechecked lipid profile - improving 

## 2023-06-07 NOTE — Assessment & Plan Note (Signed)
Has tried omeprazole, Dexilant and rabeprazole Needs to avoid NSAIDs, advised to take Tylenol or Voltaren gel for arthritis Followed by Dr. Marletta Lor - on Theda Sers

## 2023-06-26 ENCOUNTER — Other Ambulatory Visit: Payer: Self-pay | Admitting: Internal Medicine

## 2023-06-26 DIAGNOSIS — E039 Hypothyroidism, unspecified: Secondary | ICD-10-CM

## 2023-07-26 ENCOUNTER — Other Ambulatory Visit (HOSPITAL_COMMUNITY): Payer: Self-pay | Admitting: Internal Medicine

## 2023-07-26 DIAGNOSIS — Z1231 Encounter for screening mammogram for malignant neoplasm of breast: Secondary | ICD-10-CM

## 2023-09-06 ENCOUNTER — Ambulatory Visit: Payer: Medicare Other | Admitting: Internal Medicine

## 2023-09-11 ENCOUNTER — Ambulatory Visit (HOSPITAL_COMMUNITY)
Admission: RE | Admit: 2023-09-11 | Discharge: 2023-09-11 | Disposition: A | Discharge status: Home / Self Care | Payer: Medicare Other | Source: Ambulatory Visit | Attending: Internal Medicine | Admitting: Internal Medicine

## 2023-09-11 ENCOUNTER — Ambulatory Visit (INDEPENDENT_AMBULATORY_CARE_PROVIDER_SITE_OTHER): Payer: Medicare Other

## 2023-09-11 ENCOUNTER — Encounter (HOSPITAL_COMMUNITY): Payer: Self-pay

## 2023-09-11 VITALS — Ht 66.0 in | Wt 134.0 lb

## 2023-09-11 DIAGNOSIS — Z0001 Encounter for general adult medical examination with abnormal findings: Secondary | ICD-10-CM

## 2023-09-11 DIAGNOSIS — Z1231 Encounter for screening mammogram for malignant neoplasm of breast: Secondary | ICD-10-CM | POA: Insufficient documentation

## 2023-09-11 DIAGNOSIS — Z0100 Encounter for examination of eyes and vision without abnormal findings: Secondary | ICD-10-CM

## 2023-09-11 DIAGNOSIS — H9193 Unspecified hearing loss, bilateral: Secondary | ICD-10-CM

## 2023-09-11 DIAGNOSIS — Z0000 Encounter for general adult medical examination without abnormal findings: Secondary | ICD-10-CM

## 2023-09-11 NOTE — Progress Notes (Signed)
Because this visit was a virtual/telehealth visit,  certain criteria was not obtained, such a blood pressure, CBG if applicable, and timed get up and go. Any medications not marked as "taking" were not mentioned during the medication reconciliation part of the visit. Any vitals not documented were not able to be obtained due to this being a telehealth visit or patient was unable to self-report a recent blood pressure reading due to a lack of equipment at home via telehealth. Vitals that have been documented are verbally provided by the patient.   Subjective:   Ann Reeves is a 67 y.o. female who presents for Medicare Annual (Subsequent) preventive examination.  Visit Complete: Virtual I connected with  Ann Reeves on 09/11/23 by a audio enabled telemedicine application and verified that I am speaking with the correct person using two identifiers.  Patient Location: Home  Provider Location: Home Office  I discussed the limitations of evaluation and management by telemedicine. The patient expressed understanding and agreed to proceed.  Vital Signs: Because this visit was a virtual/telehealth visit, some criteria may be missing or patient reported. Any vitals not documented were not able to be obtained and vitals that have been documented are patient reported.  Patient Medicare AWV questionnaire was completed by the patient on na; I have confirmed that all information answered by patient is correct and no changes since this date.  Cardiac Risk Factors include: advanced age (>73men, >71 women);dyslipidemia;hypertension;sedentary lifestyle;smoking/ tobacco exposure     Objective:    Today's Vitals   09/11/23 0806  Weight: 134 lb (60.8 kg)  Height: 5\' 6"  (1.676 m)   Body mass index is 21.63 kg/m.     09/11/2023    8:06 AM 01/21/2023    7:40 AM 12/17/2022    9:44 AM 08/21/2022   11:23 AM 08/18/2021    4:05 PM 02/27/2021    9:15 AM 08/11/2017   11:58 AM  Advanced Directives  Does  Patient Have a Medical Advance Directive? No No No No No No No  Would patient like information on creating a medical advance directive? Yes (MAU/Ambulatory/Procedural Areas - Information given) No - Patient declined  No - Patient declined Yes (MAU/Ambulatory/Procedural Areas - Information given) Yes (MAU/Ambulatory/Procedural Areas - Information given)     Current Medications (verified) Outpatient Encounter Medications as of 09/11/2023  Medication Sig   acetaminophen (TYLENOL) 500 MG tablet Take 1,000 mg by mouth every 6 (six) hours as needed for moderate pain.   albuterol (VENTOLIN HFA) 108 (90 Base) MCG/ACT inhaler INHALE 2 PUFFS BY MOUTH EVERY 6 HOURS AS NEEDED FOR WHEEZING OR SHORTNESS OF BREATH   calcium carbonate (TUMS - DOSED IN MG ELEMENTAL CALCIUM) 500 MG chewable tablet Chew 2 tablets by mouth daily as needed for indigestion or heartburn.   cholecalciferol (VITAMIN D) 25 MCG (1000 UNIT) tablet Take 2,000 Units by mouth daily.   levothyroxine (SYNTHROID) 200 MCG tablet TAKE 1 TABLET BY MOUTH ONCE DAILY BEFORE BREAKFAST   ondansetron (ZOFRAN) 4 MG tablet Take 1 tablet (4 mg total) by mouth every 6 (six) hours. As needed for nausea/vomiting   Vonoprazan Fumarate (VOQUEZNA) 20 MG TABS Take 1 tablet by mouth daily.   levothyroxine (SYNTHROID) 150 MCG tablet Take 1 tablet (150 mcg total) by mouth daily before breakfast. (Patient not taking: Reported on 09/11/2023)   No facility-administered encounter medications on file as of 09/11/2023.    Allergies (verified) Patient has no known allergies.   History: Past Medical History:  Diagnosis Date  GERD (gastroesophageal reflux disease)    Helicobacter pylori gastritis 02/2021   Treated with Prevpac.  Documented eradication in December 2022.   Hypertension    Hyperthyroidism- had goiter removed in 1998    Mixed hyperlipidemia 08/27/2022   Past Surgical History:  Procedure Laterality Date   BIOPSY  02/27/2021   Procedure: BIOPSY;   Surgeon: Lanelle Bal, DO;  Location: [facility];  Service: Endoscopy;;  gastric   COLONOSCOPY WITH PROPOFOL N/A 01/21/2023   Procedure: COLONOSCOPY WITH PROPOFOL;  Surgeon: Lanelle Bal, DO;  Location: [facility];  Service: Endoscopy;  Laterality: N/A;  900am, asa 2   ESOPHAGOGASTRODUODENOSCOPY (EGD) WITH PROPOFOL N/A 02/27/2021   Surgeon: Lanelle Bal, DO; H. pylori gastritis.  Treated with Prevpac.   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 [place]  Tobacco Use   Smoking status: Some Days    Current packs/day: 0.50    Average packs/day: 0.5 packs/day for 10.0 years (5.0 ttl pk-yrs)    Types: Cigarettes    Passive exposure: Current   Smokeless tobacco: Never  Vaping Use   Vaping status: Never Used  Substance and Sexual Activity   Alcohol use: Yes    Alcohol/week: 2.0 standard drinks of alcohol    Types: 2 Glasses of wine per week    Comment: occ   Drug use: Never   Sexual activity: Yes    Birth control/protection: Post-menopausal  Other Topics Concern   Not on file  Social History Narrative   Son lives in [place]; family lives in [place], moved to [place] at age 46   Social Determinants of Health   Financial Resource Strain: Low Risk  (09/11/2023)   Overall Financial Resource Strain (CARDIA)    Difficulty of Paying Living Expenses: Not hard at all  Food Insecurity: No Food Insecurity (09/11/2023)   Hunger Vital Sign    Worried About Running Out of Food in the Last Year: Never true     Ran Out of Food in the Last Year: Never true  Transportation Needs: No Transportation Needs (09/11/2023)   PRAPARE - Administrator, Civil Service (Medical): No    Lack of Transportation (Non-Medical): No  Physical Activity: Insufficiently Active (09/11/2023)   Exercise Vital Sign    Days of Exercise per Week: 2 days    Minutes of Exercise per Session: 20 min  Stress: No Stress Concern Present (09/11/2023)   [employer] of Occupational Health - Occupational Stress Questionnaire    Feeling of Stress : Only a little  Social Connections: Moderately Integrated (09/11/2023)   Social Connection and Isolation Panel [NHANES]    Frequency of Communication with Friends and Family: More than three times a week    Frequency of Social Gatherings with Friends and Family: More than three times a week    Attends Religious Services: More than 4 times per year    Active Member of [employer] or Organizations:  No    Attends Banker Meetings: Never    Marital Status: Married    Tobacco Counseling Ready to quit: Yes Counseling given: Yes   Clinical Intake:  Pre-visit preparation completed: Yes  Pain : No/denies pain     BMI - recorded: 21.63 Nutritional Status: BMI of 19-24  Normal Nutritional Risks: None Diabetes: No  How often do you need to have someone help you when you read instructions, pamphlets, or other written materials from your doctor or pharmacy?: 1 - Never  Interpreter Needed?: No  Information entered by :: Abby Jontez Redfield, CMA   Activities of Daily Living    09/11/2023    8:16 AM 01/17/2023    8:56 AM  In your present state of health, do you have any difficulty performing the following activities:  Hearing? 0 0  Vision? 0 0  Difficulty concentrating or making decisions? 0 0  Walking or climbing stairs? 0 0  Dressing or bathing? 0 0  Doing errands, shopping? 0   Preparing Food and eating ? N   Using the Toilet? N   In the past six months, have  you accidently leaked urine? N   Do you have problems with loss of bowel control? N   Managing your Medications? N   Managing your Finances? N   Housekeeping or managing your Housekeeping? N     Patient Care Team: Anabel Halon, MD as PCP - General (Internal Medicine) Lanelle Bal, DO as Consulting Physician (Internal Medicine)  Indicate any recent Medical Services you may have received from other than [facility] providers in the past year (date may be approximate).     Assessment:   This is a routine wellness examination for Ann Reeves.  Hearing/Vision screen Hearing Screening - Comments:: Patient c/o bilateral hearing difficulties. Referral placed today for patient.  Vision Screening - Comments:: Wears rx glasses - up to date with routine eye exams with [facility]   Goals Addressed             This Visit's Progress    Patient Stated       Get back into baking more.        Depression Screen    09/11/2023    8:10 AM 06/05/2023   10:26 AM 02/26/2023   11:19 AM 01/09/2023    1:15 PM 08/27/2022   11:17 AM 08/21/2022   11:23 AM 02/01/2022   10:31 AM  PHQ 2/9 Scores  PHQ - 2 Score 0 0 0 0 0 0 0  PHQ- 9 Score 3 1         Fall Risk    09/11/2023    8:16 AM 06/05/2023   10:26 AM 02/26/2023   11:19 AM 01/09/2023    1:15 PM 08/27/2022   11:16 AM  Fall Risk   Falls in the past year? 0 1 1 0 0  Number falls in past yr: 0 1 0 0 0  Injury with Fall? 0 0 0 0 0  Risk for fall due to : No Fall Risks Impaired balance/gait   No Fall Risks  Follow up Falls prevention discussed Falls evaluation completed   Falls evaluation completed    MEDICARE RISK AT HOME: Medicare Risk at Home Any stairs in or around the home?: Yes If so, are there any without handrails?: No Home free of loose throw rugs in walkways, pet beds, electrical cords, etc?: Yes Adequate lighting in your home to reduce risk of falls?: Yes Life alert?: No  Use of a cane, walker or w/c?: No Grab bars in the bathroom?:  Yes Shower chair or bench in shower?: Yes Elevated toilet seat or a handicapped toilet?: No  TIMED UP AND GO:  Was the test performed?  No    Cognitive Function:    08/21/2022   11:25 AM 08/18/2021    4:07 PM  MMSE - Mini Mental State Exam  Not completed: Unable to complete Unable to complete        09/11/2023    8:10 AM 08/21/2022   11:25 AM 08/18/2021    4:07 PM  6CIT Screen  What Year? 0 points 0 points 0 points  What month? 0 points 0 points 0 points  What time? 0 points 0 points 0 points  Count back from 20 0 points 0 points 0 points  Months in reverse 0 points 0 points 0 points  Repeat phrase 0 points 0 points 0 points  Total Score 0 points 0 points 0 points    Immunizations Immunization History  Administered Date(s) Administered   Moderna SARS-COV2 Booster Vaccination 01/08/2020, 02/06/2020, 11/07/2020   PNEUMOCOCCAL CONJUGATE-20 02/01/2022   Tdap 08/11/2017    TDAP status: Up to date  Flu Vaccine status: Due, Education has been provided regarding the importance of this vaccine. Advised may receive this vaccine at local pharmacy or Health Dept. Aware to provide a copy of the vaccination record if obtained from local pharmacy or Health Dept. Verbalized acceptance and understanding.  Pneumococcal vaccine status: Up to date  Covid-19 vaccine status: Information provided on how to obtain vaccines.   Qualifies for Shingles Vaccine? Yes   Zostavax completed No   Shingrix Completed?: No.    Education has been provided regarding the importance of this vaccine. Patient has been advised to call insurance company to determine out of pocket expense if they have not yet received this vaccine. Advised may also receive vaccine at local pharmacy or Health Dept. Verbalized acceptance and understanding.  Screening Tests Health Maintenance  Topic Date Due   Zoster Vaccines- Shingrix (1 of 2) Never done   INFLUENZA VACCINE  Never done   COVID-19 Vaccine (1 - 2023-24 season)  07/28/2023   Medicare Annual Wellness (AWV)  08/22/2023   MAMMOGRAM  09/08/2023   DEXA SCAN  09/07/2024   DTaP/Tdap/Td (2 - Td or Tdap) 08/12/2027   Colonoscopy  01/21/2033   Pneumonia Vaccine 57+ Years old  Completed   Hepatitis C Screening  Completed   HPV VACCINES  Aged Out    Health Maintenance  Health Maintenance Due  Topic Date Due   Zoster Vaccines- Shingrix (1 of 2) Never done   INFLUENZA VACCINE  Never done   COVID-19 Vaccine (1 - 2023-24 season) 07/28/2023   Medicare Annual Wellness (AWV)  08/22/2023   MAMMOGRAM  09/08/2023    Colorectal cancer screening: Type of screening: Colonoscopy. Completed 01/21/2023. Repeat every 5 years  Mammogram Status: Scheduled for today 09/11/2023  Bone Density status: Completed 09/07/2022. Results reflect: Bone density results: OSTEOPENIA. Repeat every 2 years.  Lung Cancer Screening: (Low Dose CT Chest recommended if Age 55-80 years, 20 pack-year currently smoking OR have quit w/in 15years.) does not qualify.   Lung Cancer Screening Referral: na  Additional Screening:  Hepatitis C Screening: does not qualify; Completed 10/27/2021  Vision Screening: Recommended annual ophthalmology exams for early detection of glaucoma and other disorders of the eye. Is the patient up to date with their annual eye exam?  Yes  Who is the  provider or what is the name of the office in which the patient attends annual eye exams? [facility] If pt is not established with a provider, would they like to be referred to a provider to establish care? No .   Dental Screening: Recommended annual dental exams for proper oral hygiene  Diabetic Foot Exam: na  Community Resource Referral / Chronic Care Management: CRR required this visit?  No   CCM required this visit?  No     Plan:     I have personally reviewed and noted the following in the patient's chart:   Medical and social history Use of alcohol, tobacco or illicit drugs  Current medications  and supplements including opioid prescriptions. Patient is not currently taking opioid prescriptions. Functional ability and status Nutritional status Physical activity Advanced directives List of other physicians Hospitalizations, surgeries, and ER visits in previous 12 months Vitals Screenings to include cognitive, depression, and falls Referrals and appointments  In addition, I have reviewed and discussed with patient certain preventive protocols, quality metrics, and best practice recommendations. A written personalized care plan for preventive services as well as general preventive health recommendations were provided to patient.     Jordan Hawks Chrishaun Sasso, CMA   09/11/2023   After Visit Summary: (MyChart) Due to this being a telephonic visit, the after visit summary with patients personalized plan was offered to patient via MyChart   Nurse Notes: see routing comment

## 2023-09-11 NOTE — Addendum Note (Signed)
Addended by: Recardo Evangelist A on: 09/11/2023 02:09 PM   Modules accepted: Orders

## 2023-09-11 NOTE — Patient Instructions (Signed)
Ann Reeves , Thank you for taking time to come for your Medicare Wellness Visit. I appreciate your ongoing commitment to your health goals. Please review the following plan we discussed and let me know if I can assist you in the future.   Referrals/Orders/Follow-Ups/Clinician Recommendations:  Next Medicare Annual Wellness Visit: September 14, 2024 at 8:40 am virtual visit  You are due for the vaccines checked below. You may have these done at your preferred pharmacy. Please have them fax the office proof of the vaccines so that we can update your chart.   [x]  Flu (due annually)  Recommended this fall either at PCP office or through your local pharmacy. The flu season starts August 1 of each year.   [x]  Shingrix (Shingles vaccine): CDC recommends 2 doses of Shingrix separated by 2-6 months for aged 65 years and older:  []  Pneumonia Vaccines: Recommended for adults 65 years or older  []  TDAP (Tetanus) Vaccine every 10 years:Recommended every 10 years; Please call your insurance company to determine your out of pocket expense. You also receive this vaccine at your local pharmacy or Health Dept.  [x]  Covid-19: Available now at any [facility] pharmacy (see info below)  You may also get your vaccines at any [facility] (locations listed below.) Vaccine hours are Monday - Friday 9:00 - 4:00. No appointments are required. Most insurances are accepted including Medicaid. Anyone can use the community pharmacies, and people are not required to have a [facility] provider.  Community Pharmacy Locations offering vaccines:   Sport and exercise psychologist   [facility]  10 vaccines are offered at the [employer]: Covid, flu, Tdap, shingles, RSV, pneumonia, meningococcal, hepatitis A, hepatitis B, and HPV.    This is a list of the screening recommended for you and due dates:  Health  Maintenance  Topic Date Due   Zoster (Shingles) Vaccine (1 of 2) Never done   Flu Shot  Never done   COVID-19 Vaccine (1 - 2023-24 season) 07/28/2023   Mammogram  09/08/2023   DEXA scan (bone density measurement)  09/07/2024   Medicare Annual Wellness Visit  09/10/2024   DTaP/Tdap/Td vaccine (2 - Td or Tdap) 08/12/2027   Colon Cancer Screening  01/22/2028   Pneumonia Vaccine  Completed   Hepatitis C Screening  Completed   HPV Vaccine  Aged Out    Advanced directives: (Provided) Advance directive discussed with you today. I have provided a copy for you to complete at home and have notarized. Once this is complete, please bring a copy in to our office so we can scan it into your chart.   Next Medicare Annual Wellness Visit scheduled for next year: Yes  Preventive Care 29 Years and Older, Female Preventive care refers to lifestyle choices and visits with your health care provider that can promote health and wellness. Preventive care visits are also called wellness exams. What can I expect for my preventive care visit? Counseling Your health care provider may ask you questions about your: Medical history, including: Past medical problems. Family medical history. Pregnancy and menstrual history. History of falls. Current health, including: Memory and ability to understand (cognition). Emotional well-being. Home life and relationship well-being. Sexual activity and sexual health. Lifestyle, including: Alcohol, nicotine or tobacco, and drug use. Access to firearms. Diet, exercise, and sleep habits. Work and work Astronomer. Sunscreen use. Safety issues such as seatbelt and bike helmet  use. Physical exam Your health care provider will check your: Height and weight. These may be used to calculate your BMI (body mass index). BMI is a measurement that tells if you are at a healthy weight. Waist circumference. This measures the distance around your waistline. This measurement also  tells if you are at a healthy weight and may help predict your risk of certain diseases, such as type 2 diabetes and high blood pressure. Heart rate and blood pressure. Body temperature. Skin for abnormal spots. What immunizations do I need?  Vaccines are usually given at various ages, according to a schedule. Your health care provider will recommend vaccines for you based on your age, medical history, and lifestyle or other factors, such as travel or where you work. What tests do I need? Screening Your health care provider may recommend screening tests for certain conditions. This may include: Lipid and cholesterol levels. Hepatitis C test. Hepatitis B test. HIV (human immunodeficiency virus) test. STI (sexually transmitted infection) testing, if you are at risk. Lung cancer screening. Colorectal cancer screening. Diabetes screening. This is done by checking your blood sugar (glucose) after you have not eaten for a while (fasting). Mammogram. Talk with your health care provider about how often you should have regular mammograms. BRCA-related cancer screening. This may be done if you have a family history of breast, ovarian, tubal, or peritoneal cancers. Bone density scan. This is done to screen for osteoporosis. Talk with your health care provider about your test results, treatment options, and if necessary, the need for more tests. Follow these instructions at home: Eating and drinking  Eat a diet that includes fresh fruits and vegetables, whole grains, lean protein, and low-fat dairy products. Limit your intake of foods with high amounts of sugar, saturated fats, and salt. Take vitamin and mineral supplements as recommended by your health care provider. Do not drink alcohol if your health care provider tells you not to drink. If you drink alcohol: Limit how much you have to 0-1 drink a day. Know how much alcohol is in your drink. In the [place] one drink equals one 12 oz bottle of beer  (355 mL), one 5 oz glass of wine (148 mL), or one 1 oz glass of hard liquor (44 mL). Lifestyle Brush your teeth every morning and night with fluoride toothpaste. Floss one time each day. Exercise for at least 30 minutes 5 or more days each week. Do not use any products that contain nicotine or tobacco. These products include cigarettes, chewing tobacco, and vaping devices, such as e-cigarettes. If you need help quitting, ask your health care provider. Do not use drugs. If you are sexually active, practice safe sex. Use a condom or other form of protection in order to prevent STIs. Take aspirin only as told by your health care provider. Make sure that you understand how much to take and what form to take. Work with your health care provider to find out whether it is safe and beneficial for you to take aspirin daily. Ask your health care provider if you need to take a cholesterol-lowering medicine (statin). Find healthy ways to manage stress, such as: Meditation, yoga, or listening to music. Journaling. Talking to a trusted person. Spending time with friends and family. Minimize exposure to UV radiation to reduce your risk of skin cancer. Safety Always wear your seat belt while driving or riding in a vehicle. Do not drive: If you have been drinking alcohol. Do not ride with someone who has been  drinking. When you are tired or distracted. While texting. If you have been using any mind-altering substances or drugs. Wear a helmet and other protective equipment during sports activities. If you have firearms in your house, make sure you follow all gun safety procedures. What's next? Visit your health care provider once a year for an annual wellness visit. Ask your health care provider how often you should have your eyes and teeth checked. Stay up to date on all vaccines. This information is not intended to replace advice given to you by your health care provider. Make sure you discuss any  questions you have with your health care provider. Document Revised: 05/10/2021 Document Reviewed: 05/10/2021 Elsevier Patient Education  2024 [employer]. Understanding Your Risk for Falls Millions of people have serious injuries from falls each year. It is important to understand your risk of falling. Talk with your health care provider about your risk and what you can do to lower it. If you do have a serious fall, make sure to tell your provider. Falling once raises your risk of falling again. How can falls affect me? Serious injuries from falls are common. These include: Broken bones, such as hip fractures. Head injuries, such as traumatic brain injuries (TBI) or concussions. A fear of falling can cause you to avoid activities and stay at home. This can make your muscles weaker and raise your risk for a fall. What can increase my risk? There are a number of risk factors that increase your risk for falling. The more risk factors you have, the higher your risk of falling. Serious injuries from a fall happen most often to people who are older than 69 years old. Teenagers and young adults ages 23-29 are also at higher risk. Common risk factors include: Weakness in the lower body. Being generally weak or confused due to long-term (chronic) illness. Dizziness or balance problems. Poor vision. Medicines that cause dizziness or drowsiness. These may include: Medicines for your blood pressure, heart, anxiety, insomnia, or swelling (edema). Pain medicines. Muscle relaxants. Other risk factors include: Drinking alcohol. Having had a fall in the past. Having foot pain or wearing improper footwear. Working at a dangerous job. Having any of the following in your home: Tripping hazards, such as floor clutter or loose rugs. Poor lighting. Pets. Having dementia or memory loss. What actions can I take to lower my risk of falling?     Physical activity Stay physically fit. Do strength and  balance exercises. Consider taking a regular class to build strength and balance. Yoga and tai chi are good options. Vision Have your eyes checked every year and your prescription for glasses or contacts updated as needed. Shoes and walking aids Wear non-skid shoes. Wear shoes that have rubber soles and low heels. Do not wear high heels. Do not walk around the house in socks or slippers. Use a cane or walker as told by your provider. Home safety Attach secure railings on both sides of your stairs. Install grab bars for your bathtub, shower, and toilet. Use a non-skid mat in your bathtub or shower. Attach bath mats securely with double-sided, non-slip rug tape. Use good lighting in all rooms. Keep a flashlight near your bed. Make sure there is a clear path from your bed to the bathroom. Use night-lights. Do not use throw rugs. Make sure all carpeting is taped or tacked down securely. Remove all clutter from walkways and stairways, including extension cords. Repair uneven or broken steps and floors. Avoid walking on  icy or slippery surfaces. Walk on the grass instead of on icy or slick sidewalks. Use ice melter to get rid of ice on walkways in the winter. Use a cordless phone. Questions to ask your health care provider Can you help me check my risk for a fall? Do any of my medicines make me more likely to fall? Should I take a vitamin D supplement? What exercises can I do to improve my strength and balance? Should I make an appointment to have my vision checked? Do I need a bone density test to check for weak bones (osteoporosis)? Would it help to use a cane or a walker? Where to find more information Centers for Disease Control and Prevention, STEADI: TonerPromos.no Community-Based Fall Prevention Programs: TonerPromos.no [employer] on Aging: BaseRingTones.pl Contact a health care provider if: You fall at home. You are afraid of falling at home. You feel weak, drowsy, or dizzy. This  information is not intended to replace advice given to you by your health care provider. Make sure you discuss any questions you have with your health care provider. Document Revised: 07/16/2022 Document Reviewed: 07/16/2022 Elsevier Patient Education  2024 Elsevier Inc. Steps to Quit Smoking Smoking tobacco is the leading cause of preventable death. It can affect almost every organ in the body. Smoking puts you and people around you at risk for many serious, long-lasting (chronic) diseases. Quitting smoking can be hard, but it is one of the best things that you can do for your health. It is never too late to quit. Do not give up if you cannot quit the first time. Some people need to try many times to quit. Do your best to stick to your quit plan, and talk with your doctor if you have any questions or concerns. How do I get ready to quit? Pick a date to quit. Set a date within the next 2 weeks to give you time to prepare. Write down the reasons why you are quitting. Keep this list in places where you will see it often. Tell your family, friends, and co-workers that you are quitting. Their support is important. Talk with your doctor about the choices that may help you quit. Find out if your health insurance will pay for these treatments. Know the people, places, things, and activities that make you want to smoke (triggers). Avoid them. What first steps can I take to quit smoking? Throw away all cigarettes at home, at work, and in your car. Throw away the things that you use when you smoke, such as ashtrays and lighters. Clean your car. Empty the ashtray. Clean your home, including curtains and carpets. What can I do to help me quit smoking? Talk with your doctor about taking medicines and seeing a counselor. You are more likely to succeed when you do both. If you are pregnant or breastfeeding: Talk with your doctor about counseling or other ways to quit smoking. Do not take medicine to help you  quit smoking unless your doctor tells you to. Quit right away Quit smoking completely, instead of slowly cutting back on how much you smoke over a period of time. Stopping smoking right away may be more successful than slowly quitting. Go to counseling. In-person is best if this is an option. You are more likely to quit if you go to counseling sessions regularly. Take medicine You may take medicines to help you quit. Some medicines need a prescription, and some you can buy over-the-counter. Some medicines may contain a drug called  nicotine to replace the nicotine in cigarettes. Medicines may: Help you stop having the desire to smoke (cravings). Help to stop the problems that come when you stop smoking (withdrawal symptoms). Your doctor may ask you to use: Nicotine patches, gum, or lozenges. Nicotine inhalers or sprays. Non-nicotine medicine that you take by mouth. Find resources Find resources and other ways to help you quit smoking and remain smoke-free after you quit. They include: Online chats with a Veterinary surgeon. Phone quitlines. Printed Materials engineer. Support groups or group counseling. Text messaging programs. Mobile phone apps. Use apps on your mobile phone or tablet that can help you stick to your quit plan. Examples of free services include Quit Guide from the CDC and smokefree.gov  What can I do to make it easier to quit?  Talk to your family and friends. Ask them to support and encourage you. Call a phone quitline, such as 1-800-QUIT-NOW, reach out to support groups, or work with a Veterinary surgeon. Ask people who smoke to not smoke around you. Avoid places that make you want to smoke, such as: Bars. Parties. Smoke-break areas at work. Spend time with people who do not smoke. Lower the stress in your life. Stress can make you want to smoke. Try these things to lower stress: Getting regular exercise. Doing deep-breathing exercises. Doing yoga. Meditating. What benefits will  I see if I quit smoking? Over time, you may have: A better sense of smell and taste. Less coughing and sore throat. A slower heart rate. Lower blood pressure. Clearer skin. Better breathing. Fewer sick days. Summary Quitting smoking can be hard, but it is one of the best things that you can do for your health. Do not give up if you cannot quit the first time. Some people need to try many times to quit. When you decide to quit smoking, make a plan to help you succeed. Quit smoking right away, not slowly over a period of time. When you start quitting, get help and support to keep you smoke-free. This information is not intended to replace advice given to you by your health care provider. Make sure you discuss any questions you have with your health care provider. Document Revised: 11/03/2021 Document Reviewed: 11/03/2021 Elsevier Patient Education  2024 [employer].

## 2023-09-12 ENCOUNTER — Ambulatory Visit: Payer: Medicare Other | Admitting: Internal Medicine

## 2023-09-18 ENCOUNTER — Ambulatory Visit: Payer: Medicare Other | Admitting: Internal Medicine

## 2023-09-19 ENCOUNTER — Other Ambulatory Visit: Payer: Self-pay | Admitting: Internal Medicine

## 2023-09-19 DIAGNOSIS — E039 Hypothyroidism, unspecified: Secondary | ICD-10-CM

## 2023-10-02 ENCOUNTER — Ambulatory Visit (INDEPENDENT_AMBULATORY_CARE_PROVIDER_SITE_OTHER): Payer: Medicare Other | Admitting: Internal Medicine

## 2023-10-02 ENCOUNTER — Encounter: Payer: Self-pay | Admitting: Internal Medicine

## 2023-10-02 VITALS — BP 109/70 | HR 64 | Ht 66.0 in | Wt 132.8 lb

## 2023-10-02 DIAGNOSIS — M25511 Pain in right shoulder: Secondary | ICD-10-CM | POA: Diagnosis not present

## 2023-10-02 DIAGNOSIS — I10 Essential (primary) hypertension: Secondary | ICD-10-CM | POA: Diagnosis not present

## 2023-10-02 DIAGNOSIS — E039 Hypothyroidism, unspecified: Secondary | ICD-10-CM | POA: Diagnosis not present

## 2023-10-02 DIAGNOSIS — G8929 Other chronic pain: Secondary | ICD-10-CM | POA: Insufficient documentation

## 2023-10-02 NOTE — Assessment & Plan Note (Signed)
BP Readings from Last 1 Encounters:  10/02/23 109/70   New onset in the last visit - well-controlled now Could be due to oversupplemented thyroid as well Advised DASH diet and moderate exercise/walking, at least 150 mins/week If  persistently elevated, will add amlodipine

## 2023-10-02 NOTE — Assessment & Plan Note (Addendum)
Has tenderness in the anterior part of shoulder Could be tendinitis or bursitis Advised to take Tylenol as needed for pain Unable to take oral NSAIDs due to GERD Apply ice or cool compresses as needed If persistent, will refer to orthopedic surgery

## 2023-10-02 NOTE — Progress Notes (Signed)
Established Patient Office Visit  Subjective:  Patient ID: Ann Reeves, female    DOB: 01-27-1956  Age: 67 y.o. MRN: [id number]  CC:  Chief Complaint  Patient presents with   Hypertension    Three month follow up    Hypothyroidism    Three month follow up    Shoulder Pain    Right shoulder pain , difficulty raising arm     HPI Ann Reeves is a 67 y.o. female with past medical history of GERD, hypothyroidism and sciatica who presents for f/u of her chronic medical conditions.  GERD: She has been feeling better with Voquezna now.  She has been treated for H. pylori in the past.  She denies any dysphagia or odynophagia currently.  Hypothyroidism: Her TSH was low and her free T4 was elevated in 07/24.  She has been taking levothyroxine 200 mcg daily despite decreasing dose to 150 mcg in 07/24 due to pharmacy dispensing or refill error.  Denies any tremors or palpitations currently.  She has lost 7 lbs since the last visit.  HTN: Her BP was  wnl today. She denies any headache, dizziness, chest pain, dyspnea or palpitations currently.  She reports right shoulder pain since 09/18/23.  Her pain is intermittent, dull, in anterior area, worse with above shoulder extension and better with Tylenol.  Denies any recent injury or overhead lifting.  Denies any numbness or tingling of UE currently.  Past Medical History:  Diagnosis Date   GERD (gastroesophageal reflux disease)    Helicobacter pylori gastritis 02/2021   Treated with Prevpac.  Documented eradication in December 2022.   Hypertension    Hyperthyroidism- had goiter removed in 1998    Mixed hyperlipidemia 08/27/2022    Past Surgical History:  Procedure Laterality Date   BIOPSY  02/27/2021   Procedure: BIOPSY;  Surgeon: Lanelle Bal, DO;  Location: [facility];  Service: Endoscopy;;  gastric   COLONOSCOPY WITH PROPOFOL N/A 01/21/2023   Procedure: COLONOSCOPY WITH PROPOFOL;  Surgeon: Lanelle Bal, DO;  Location: [facility];  Service: Endoscopy;  Laterality: N/A;  900am, asa 2   ESOPHAGOGASTRODUODENOSCOPY (EGD) WITH PROPOFOL N/A 02/27/2021   Surgeon: Lanelle Bal, DO; H. pylori gastritis.  Treated with Prevpac.   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 [place]  Tobacco Use   Smoking status: Some Days    Current packs/day: 0.50    Average packs/day: 0.5 packs/day for 10.0 years (5.0 ttl pk-yrs)    Types: Cigarettes    Passive exposure: Current   Smokeless tobacco: Never  Vaping Use   Vaping status: Never Used  Substance and Sexual Activity   Alcohol use: Yes    Alcohol/week: 2.0 standard drinks of alcohol    Types: 2 Glasses of wine per week    Comment: occ   Drug use: Never   Sexual activity: Yes    Birth control/protection: Post-menopausal  Other  Topics Concern   Not on file  Social History Narrative   Son lives in [place]; family lives in [place], moved to [place] at age 72   Social Determinants of Health   Financial Resource Strain: Low Risk  (09/11/2023)   Overall Financial Resource Strain (CARDIA)    Difficulty of Paying Living Expenses: Not hard at all  Food Insecurity: No Food Insecurity (09/11/2023)   Hunger Vital Sign    Worried About Running Out of Food in the Last Year: Never true    Ran Out of Food in the Last Year: Never true  Transportation Needs: No Transportation Needs (09/11/2023)   PRAPARE - Administrator, Civil Service (Medical): No    Lack of Transportation (Non-Medical):  No  Physical Activity: Insufficiently Active (09/11/2023)   Exercise Vital Sign    Days of Exercise per Week: 2 days    Minutes of Exercise per Session: 20 min  Stress: No Stress Concern Present (09/11/2023)   [employer] of Occupational Health - Occupational Stress Questionnaire    Feeling of Stress : Only a little  Social Connections: Moderately Integrated (09/11/2023)   Social Connection and Isolation Panel [NHANES]    Frequency of Communication with Friends and Family: More than three times a week    Frequency of Social Gatherings with Friends and Family: More than three times a week    Attends Religious Services: More than 4 times per year    Active Member of [employer] or Organizations: No    Attends Banker Meetings: Never    Marital Status: Married  Catering manager Violence: Not At Risk (09/11/2023)   Humiliation, Afraid, Rape, and Kick questionnaire    Fear of Current or Ex-Partner: No    Emotionally Abused: No    Physically Abused: No    Sexually Abused: No    Outpatient Medications Prior to Visit  Medication Sig Dispense Refill   levothyroxine (SYNTHROID) 150 MCG tablet Take 150 mcg by mouth daily before breakfast.     acetaminophen (TYLENOL) 500 MG tablet Take 1,000 mg by mouth every 6 (six) hours as needed for moderate pain.     albuterol (VENTOLIN HFA) 108 (90 Base) MCG/ACT inhaler INHALE 2 PUFFS BY MOUTH EVERY 6 HOURS AS NEEDED FOR WHEEZING OR SHORTNESS OF BREATH 18 g 0   calcium carbonate (TUMS - DOSED IN MG ELEMENTAL CALCIUM) 500 MG chewable tablet Chew 2 tablets by mouth daily as needed for indigestion or heartburn.     cholecalciferol (VITAMIN D) 25 MCG (1000 UNIT) tablet Take 2,000 Units by mouth daily.     ondansetron (ZOFRAN) 4 MG tablet Take 1 tablet (4 mg total) by mouth every 6 (six) hours. As needed for nausea/vomiting 12 tablet 0   Vonoprazan Fumarate (VOQUEZNA) 20 MG TABS Take 1 tablet by mouth daily. 30 tablet 11   levothyroxine (SYNTHROID)  150 MCG tablet Take 1 tablet (150 mcg total) by mouth daily before breakfast. (Patient not taking: Reported on 09/11/2023) 30 tablet 3   levothyroxine (SYNTHROID) 200 MCG tablet TAKE 1 TABLET BY MOUTH ONCE DAILY BEFORE BREAKFAST 90 tablet 0   No facility-administered medications prior to visit.    No Known Allergies  ROS Review of Systems  Constitutional:  Positive for unexpected weight change. Negative for chills and fever.  HENT:  Negative for congestion, sinus pressure, sinus pain and sore throat.   Eyes:  Negative for pain and discharge.  Respiratory:  Negative for cough and shortness of breath.  Cardiovascular:  Negative for chest pain and palpitations.  Gastrointestinal:  Positive for abdominal pain (Epigastric, burning). Negative for constipation, diarrhea, nausea and vomiting.  Endocrine: Negative for polydipsia and polyuria.  Genitourinary:  Negative for dysuria and hematuria.  Musculoskeletal:  Positive for arthralgias (Right shoulder). Negative for neck pain and neck stiffness.  Skin:  Negative for rash.  Neurological:  Negative for dizziness and weakness.  Psychiatric/Behavioral:  Negative for agitation and behavioral problems.       Objective:    Physical Exam Vitals reviewed.  Constitutional:      General: She is not in acute distress.    Appearance: She is not diaphoretic.  HENT:     Head: Normocephalic and atraumatic.     Nose: Nose normal. No congestion.     Mouth/Throat:     Mouth: Mucous membranes are moist.     Pharynx: No posterior oropharyngeal erythema.  Eyes:     General: No scleral icterus.    Extraocular Movements: Extraocular movements intact.  Neck:     Thyroid: No thyromegaly.  Cardiovascular:     Rate and Rhythm: Normal rate and regular rhythm.     Pulses: Normal pulses.     Heart sounds: Normal heart sounds. No murmur heard. Pulmonary:     Breath sounds: Normal breath sounds. No wheezing or rales.  Musculoskeletal:     Right shoulder:  Tenderness (Anterior part) present.     Cervical back: Neck supple. No tenderness.     Right lower leg: No edema.     Left lower leg: No edema.     Comments: ROM and right shoulder painful  Skin:    General: Skin is warm.     Findings: No rash.  Neurological:     General: No focal deficit present.     Mental Status: She is alert and oriented to person, place, and time.  Psychiatric:        Mood and Affect: Mood normal.        Behavior: Behavior normal.     BP 109/70 (BP Location: Left Arm, Patient Position: Sitting, Cuff Size: Normal)   Pulse 64   Ht 5\' 6"  (1.676 m)   Wt 132 lb 12.8 oz (60.2 kg)   SpO2 93%   BMI 21.43 kg/m  Wt Readings from Last 3 Encounters:  10/02/23 132 lb 12.8 oz (60.2 kg)  09/11/23 134 lb (60.8 kg)  06/05/23 139 lb (63 kg)    Lab Results  Component Value Date   TSH 0.149 (L) 06/05/2023   Lab Results  Component Value Date   WBC 4.1 12/17/2022   HGB 13.2 12/17/2022   HCT 40.9 12/17/2022   MCV 89.3 12/17/2022   PLT 207 12/17/2022   Lab Results  Component Value Date   NA 143 06/05/2023   K 3.5 06/05/2023   CO2 25 06/05/2023   GLUCOSE 65 (L) 06/05/2023   BUN 9 06/05/2023   CREATININE 0.87 06/05/2023   BILITOT 0.4 06/05/2023   ALKPHOS 94 06/05/2023   AST 17 06/05/2023   ALT 12 06/05/2023   PROT 7.1 06/05/2023   ALBUMIN 4.1 06/05/2023   CALCIUM 8.3 (L) 06/05/2023   ANIONGAP 10 12/17/2022   EGFR 73 06/05/2023   Lab Results  Component Value Date   CHOL 197 01/09/2023   Lab Results  Component Value Date   HDL 57 01/09/2023   Lab Results  Component Value Date   LDLCALC 123 (H) 01/09/2023   Lab Results  Component Value Date  TRIG 94 01/09/2023   Lab Results  Component Value Date   CHOLHDL 3.5 01/09/2023   No results found for: "HGBA1C"    Assessment & Plan:   Problem List Items Addressed This Visit       Cardiovascular and Mediastinum   Essential hypertension - Primary    BP Readings from Last 1 Encounters:   10/02/23 109/70   New onset in the last visit - well-controlled now Could be due to oversupplemented thyroid as well Advised DASH diet and moderate exercise/walking, at least 150 mins/week If  persistently elevated, will add amlodipine         Endocrine   Acquired hypothyroidism    Lab Results  Component Value Date   TSH 0.149 (L) 06/05/2023    Oversupplemented Was on levothyroxine 150 mcg QD, but has been taking 200 mcg dose due to pharmacy refill error Check TSH, free T4, free T3, adjust dose of levothyroxine based on blood tests Weight loss likely due to oversupplemented thyroid      Relevant Medications   levothyroxine (SYNTHROID) 150 MCG tablet   Other Relevant Orders   TSH+T4F+T3Free     Other   Acute pain of right shoulder    Has tenderness in the anterior part of shoulder Could be tendinitis or bursitis Advised to take Tylenol as needed for pain Unable to take oral NSAIDs due to GERD Apply ice or cool compresses as needed If persistent, will refer to orthopedic surgery       No orders of the defined types were placed in this encounter.   Follow-up: Return in about 4 months (around 01/30/2024) for Hypothyroidism.    Anabel Halon, MD

## 2023-10-02 NOTE — Patient Instructions (Signed)
Please take Levothyroxine 150 mcg once daily.  Please continue to follow heart healthy diet and perform moderate exercise/walking at least 150 mins/week.

## 2023-10-02 NOTE — Assessment & Plan Note (Addendum)
Lab Results  Component Value Date   TSH 0.149 (L) 06/05/2023    Oversupplemented Was on levothyroxine 150 mcg QD, but has been taking 200 mcg dose due to pharmacy refill error Check TSH, free T4, free T3, adjust dose of levothyroxine based on blood tests Weight loss likely due to oversupplemented thyroid

## 2023-10-03 ENCOUNTER — Other Ambulatory Visit: Payer: Self-pay | Admitting: Internal Medicine

## 2023-10-03 LAB — TSH+T4F+T3FREE
Free T4: 1.3 ng/dL (ref 0.82–1.77)
T3, Free: 2.5 pg/mL (ref 2.0–4.4)
TSH: 0.273 u[IU]/mL — ABNORMAL LOW (ref 0.450–4.500)

## 2023-10-17 ENCOUNTER — Ambulatory Visit: Payer: Medicare Other | Admitting: Audiology

## 2023-12-10 ENCOUNTER — Other Ambulatory Visit: Payer: Self-pay

## 2023-12-10 ENCOUNTER — Emergency Department (HOSPITAL_COMMUNITY): Payer: Medicare Other

## 2023-12-10 ENCOUNTER — Encounter (HOSPITAL_COMMUNITY): Payer: Self-pay

## 2023-12-10 ENCOUNTER — Emergency Department (HOSPITAL_COMMUNITY)
Admission: EM | Admit: 2023-12-10 | Discharge: 2023-12-11 | Disposition: A | Discharge status: Home / Self Care | Payer: Medicare Other | Attending: Emergency Medicine | Admitting: Emergency Medicine

## 2023-12-10 DIAGNOSIS — K8689 Other specified diseases of pancreas: Secondary | ICD-10-CM | POA: Insufficient documentation

## 2023-12-10 DIAGNOSIS — E039 Hypothyroidism, unspecified: Secondary | ICD-10-CM | POA: Diagnosis not present

## 2023-12-10 DIAGNOSIS — N8500 Endometrial hyperplasia, unspecified: Secondary | ICD-10-CM | POA: Insufficient documentation

## 2023-12-10 DIAGNOSIS — Z79899 Other long term (current) drug therapy: Secondary | ICD-10-CM | POA: Diagnosis not present

## 2023-12-10 DIAGNOSIS — F17210 Nicotine dependence, cigarettes, uncomplicated: Secondary | ICD-10-CM | POA: Diagnosis not present

## 2023-12-10 DIAGNOSIS — D259 Leiomyoma of uterus, unspecified: Secondary | ICD-10-CM | POA: Diagnosis not present

## 2023-12-10 DIAGNOSIS — R1013 Epigastric pain: Secondary | ICD-10-CM | POA: Diagnosis present

## 2023-12-10 DIAGNOSIS — R0602 Shortness of breath: Secondary | ICD-10-CM | POA: Insufficient documentation

## 2023-12-10 DIAGNOSIS — K259 Gastric ulcer, unspecified as acute or chronic, without hemorrhage or perforation: Secondary | ICD-10-CM | POA: Diagnosis not present

## 2023-12-10 DIAGNOSIS — R9389 Abnormal findings on diagnostic imaging of other specified body structures: Secondary | ICD-10-CM

## 2023-12-10 DIAGNOSIS — I10 Essential (primary) hypertension: Secondary | ICD-10-CM | POA: Insufficient documentation

## 2023-12-10 DIAGNOSIS — D219 Benign neoplasm of connective and other soft tissue, unspecified: Secondary | ICD-10-CM

## 2023-12-10 LAB — BASIC METABOLIC PANEL
Anion gap: 7 (ref 5–15)
BUN: 22 mg/dL (ref 8–23)
CO2: 25 mmol/L (ref 22–32)
Calcium: 7.8 mg/dL — ABNORMAL LOW (ref 8.9–10.3)
Chloride: 104 mmol/L (ref 98–111)
Creatinine, Ser: 1.04 mg/dL — ABNORMAL HIGH (ref 0.44–1.00)
GFR, Estimated: 58 mL/min — ABNORMAL LOW (ref >60)
Glucose, Bld: 105 mg/dL — ABNORMAL HIGH (ref 70–99)
Potassium: 3.2 mmol/L — ABNORMAL LOW (ref 3.5–5.1)
Sodium: 136 mmol/L (ref 135–145)

## 2023-12-10 LAB — CBC
HCT: 37.6 % (ref 36.0–46.0)
Hemoglobin: 12.3 g/dL (ref 12.0–15.0)
MCH: 30.1 pg (ref 26.0–34.0)
MCHC: 32.7 g/dL (ref 30.0–36.0)
MCV: 92.2 fL (ref 80.0–100.0)
Platelets: 253 10*3/uL (ref 150–400)
RBC: 4.08 MIL/uL (ref 3.87–5.11)
RDW: 13 % (ref 11.5–15.5)
WBC: 9.3 10*3/uL (ref 4.0–10.5)
nRBC: 0 % (ref 0.0–0.2)

## 2023-12-10 LAB — TROPONIN I (HIGH SENSITIVITY): Troponin I (High Sensitivity): 4 ng/L (ref <18)

## 2023-12-10 MED ORDER — HYDROMORPHONE HCL 1 MG/ML IJ SOLN
1.0000 mg | Freq: Once | INTRAMUSCULAR | Status: AC
  Administered 2023-12-11: 1 mg via INTRAVENOUS
  Filled 2023-12-10: qty 1

## 2023-12-10 MED ORDER — ALUM & MAG HYDROXIDE-SIMETH 200-200-20 MG/5ML PO SUSP
30.0000 mL | Freq: Once | ORAL | Status: AC
  Administered 2023-12-11: 30 mL via ORAL
  Filled 2023-12-10: qty 30

## 2023-12-10 MED ORDER — FAMOTIDINE 20 MG PO TABS
20.0000 mg | ORAL_TABLET | Freq: Once | ORAL | Status: AC
  Administered 2023-12-11: 20 mg via ORAL
  Filled 2023-12-10: qty 1

## 2023-12-10 MED ORDER — IOHEXOL 350 MG/ML SOLN
100.0000 mL | Freq: Once | INTRAVENOUS | Status: AC | PRN
  Administered 2023-12-11: 100 mL via INTRAVENOUS

## 2023-12-10 MED ORDER — LIDOCAINE VISCOUS HCL 2 % MT SOLN
15.0000 mL | Freq: Once | OROMUCOSAL | Status: AC
  Administered 2023-12-11: 15 mL via ORAL
  Filled 2023-12-10: qty 15

## 2023-12-10 MED ORDER — PANTOPRAZOLE SODIUM 40 MG IV SOLR
40.0000 mg | Freq: Once | INTRAVENOUS | Status: AC
  Administered 2023-12-11: 40 mg via INTRAVENOUS
  Filled 2023-12-10: qty 10

## 2023-12-10 NOTE — ED Triage Notes (Signed)
 Pt c/o central chest pain that radiates around to the back. Per pt she has 3 ulcers and on medication for acid reflux. Pt states shortness of breath starting about an hour ago. Pt states attempted to lay down but pain increased.

## 2023-12-10 NOTE — ED Provider Notes (Signed)
 Emergency Department Provider Note  TRIAGE NOTE: Pt c/o central chest pain that radiates around to the back. Per pt she has 3 ulcers and on medication for acid reflux. Pt states shortness of breath starting about an hour ago. Pt states attempted to lay down but pain increased.   HISTORY  Chief Complaint Shortness of Breath   HPI Ann Reeves is a 68 y.o. female with 68 year old female with history of hypothyroidism currently being overmedicated secondary to an unknown dosing change about a year ago that they have slowly been bringing it back in, peptic ulcer disease on Voquenza who presents ER today with epigastric and chest pain.  Patient states that it started somewhat similar to her normal gastritis/ulcer pain with some burning in her epigastric area but then started radiate up towards her chest and around to her side and got much much more severe that she is used to.  Pulsate's.  She states that she has been short of breath with it because of how much it hurts.  States is low bit better at this point but still a lot worse than what she is used to.  Once again nausea but no vomiting.  No diarrhea or constipation.  She drinks alcohol but not a significant amount no more than 1 drink a night.  No diaphoresis or passing out.  No rash.  No recent trauma.  No sick contacts.  Commenting on her nasal voice that she has been of the more congested than normal recently.  PMH Past Medical History:  Diagnosis Date   GERD (gastroesophageal reflux disease)    Helicobacter pylori gastritis 02/2021   Treated with Prevpac.  Documented eradication in December 2022.   Hypertension    Hyperthyroidism- had goiter removed in 1998    Mixed hyperlipidemia 08/27/2022    Home Medications Prior to Admission medications   Medication Sig Start Date End Date Taking? Authorizing Provider  sucralfate  (CARAFATE ) 1 GM/10ML suspension Take 10 mLs (1 g total) by mouth 4 (four) times daily -  with meals and at  bedtime. 12/11/23  Yes Randi Poullard, Selinda, MD  acetaminophen  (TYLENOL ) 500 MG tablet Take 1,000 mg by mouth every 6 (six) hours as needed for moderate pain.    [provider]  albuterol  (VENTOLIN  HFA) 108 (90 Base) MCG/ACT inhaler INHALE 2 PUFFS BY MOUTH EVERY 6 HOURS AS NEEDED FOR WHEEZING OR SHORTNESS OF BREATH 12/18/22   Tobie Suzzane POUR, MD  calcium  carbonate (TUMS - DOSED IN MG ELEMENTAL CALCIUM ) 500 MG chewable tablet Chew 2 tablets by mouth daily as needed for indigestion or heartburn.    [provider]  cholecalciferol (VITAMIN D ) 25 MCG (1000 UNIT) tablet Take 2,000 Units by mouth daily.    [provider]  levothyroxine  (SYNTHROID ) 150 MCG tablet Take 150 mcg by mouth daily before breakfast.    [provider]  ondansetron  (ZOFRAN ) 4 MG tablet Take 1 tablet (4 mg total) by mouth every 6 (six) hours. As needed for nausea/vomiting 12/17/22   Triplett, Tammy, PA-C  Vonoprazan Fumarate  (VOQUEZNA ) 20 MG TABS Take 1 tablet by mouth daily. 01/09/23 01/09/24  Cindie Carlin POUR, DO    Social History Social History   Tobacco Use   Smoking status: Some Days    Current packs/day: 0.50    Average packs/day: 0.5 packs/day for 10.0 years (5.0 ttl pk-yrs)    Types: Cigarettes    Passive exposure: Current   Smokeless tobacco: Never  Vaping Use   Vaping status: Never Used  Substance Use Topics   Alcohol use: Yes    Alcohol/week: 2.0 standard drinks of alcohol    Types: 2 Glasses of wine per week    Comment: occ   Drug use: Never    Review of Systems: Documented in HPI ____________________________________________  PHYSICAL EXAM: VITAL SIGNS: ED Triage Vitals  Encounter Vitals Group     BP 12/10/23 2119 (!) 169/93     Systolic BP Percentile --      Diastolic BP Percentile --      Pulse Rate 12/10/23 2119 79     Resp 12/10/23 2119 (!) 22     Temp 12/10/23 2119 98 F (36.7 C)     Temp Source 12/10/23 2119 Oral     SpO2 12/10/23 2119 100 %     Weight  12/10/23 2112 132 lb 11.5 oz (60.2 kg)     Height 12/10/23 2112 5' 6 (1.676 m)     Head Circumference --      Peak Flow --      Pain Score 12/10/23 2112 10     Pain Loc --      Pain Education --      Exclude from Growth Chart --    Physical Exam Vitals and nursing note reviewed.  Constitutional:      Appearance: She is well-developed.  HENT:     Head: Normocephalic and atraumatic.  Cardiovascular:     Rate and Rhythm: Normal rate and regular rhythm.  Pulmonary:     Effort: No respiratory distress.     Breath sounds: No stridor.  Abdominal:     General: There is no distension.  Musculoskeletal:     Cervical back: Normal range of motion.  Neurological:     Mental Status: She is alert.       ____________________________________________   LABS (all labs ordered are listed, but only abnormal results are displayed)  Labs Reviewed  BASIC METABOLIC PANEL - Abnormal; Notable for the following components:      Result Value   Potassium 3.2 (*)    Glucose, Bld 105 (*)    Creatinine, Ser 1.04 (*)    Calcium  7.8 (*)    GFR, Estimated 58 (*)    All other components within normal limits  LIPASE, BLOOD - Abnormal; Notable for the following components:   Lipase 77 (*)    All other components within normal limits  CBC  HEPATIC FUNCTION PANEL  TROPONIN I (HIGH SENSITIVITY)  TROPONIN I (HIGH SENSITIVITY)   ____________________________________________  EKG   ____________________________________________  RADIOLOGY  CT Angio Chest PE W and/or Wo Contrast Result Date: 12/11/2023 CLINICAL DATA:  Pulmonary embolism (PE) suspected, high prob; Abdominal pain, acute, nonlocalized Pt c/o central chest pain that radiates around to the back. Per pt she has 3 ulcers and on medication for acid reflux. Pt states shortness of breath starting about an hour ago. Pt states attempted to lay down but pain increased. EXAM: CT ANGIOGRAPHY CHEST CT ABDOMEN AND PELVIS WITH CONTRAST TECHNIQUE:  Multidetector CT imaging of the chest was performed using the standard protocol during bolus administration of intravenous contrast. Multiplanar CT image reconstructions and MIPs were obtained to evaluate the vascular anatomy. Multidetector CT imaging of the abdomen and pelvis was performed using the standard protocol during bolus administration of intravenous contrast. RADIATION DOSE REDUCTION: This exam was performed according to the departmental dose-optimization program which includes automated exposure control, adjustment of the mA and/or kV according to patient size and/or use of iterative  reconstruction technique. CONTRAST:  OMNIPAQUE  IOHEXOL  350 MG/ML SOLN COMPARISON:  None Available. FINDINGS: CTA CHEST FINDINGS Cardiovascular: Satisfactory opacification of the pulmonary arteries to the segmental level. No evidence of pulmonary embolism. Normal heart size. No significant pericardial effusion. The thoracic aorta is normal in caliber. No atherosclerotic plaque of the thoracic aorta. No coronary artery calcifications. Mediastinum/Nodes: No enlarged mediastinal, hilar, or axillary lymph nodes. Thyroid gland, trachea, and esophagus demonstrate no significant findings. Lungs/Pleura: No focal consolidation. No pulmonary nodule. No pulmonary mass. No pleural effusion. No pneumothorax. Musculoskeletal: No chest wall abnormality. No suspicious lytic or blastic osseous lesions. No acute displaced fracture. Review of the MIP images confirms the above findings. CT ABDOMEN and PELVIS FINDINGS Hepatobiliary: Vague hypodensity along the falciform ligament likely focal fatty infiltration. No gallstones, gallbladder wall thickening, or pericholecystic fluid. No biliary dilatation. Pancreas: No focal lesion. Normal pancreatic contour. No surrounding inflammatory changes. Proximal to mid main pancreatic ductal dilatation. Spleen: Normal in size without focal abnormality. Adrenals/Urinary Tract: No adrenal nodule  bilaterally. Bilateral kidneys enhance symmetrically. No hydronephrosis. No hydroureter. The urinary bladder is unremarkable. On delayed imaging, there is no urothelial wall thickening and there are no filling defects in the opacified portions of the bilateral collecting systems or ureters. Stomach/Bowel: Fluid dilatation of the gastric antrum and duodenal bulb. Question redundant duodenum versus diverticula versus ulceration of the first portion of the duodenum (5:29, 2:22). No evidence of bowel wall thickening or dilatation. Stool throughout the majority of the colon appendix appears normal. Vascular/Lymphatic: No abdominal aorta or iliac aneurysm. Mild to moderate atherosclerotic plaque of the aorta and its branches. No abdominal, pelvic, or inguinal lymphadenopathy. Reproductive: Lobulated uterine contour with enhancing uterine fibroids and query thickened endometrium. Uterus and bilateral adnexa are otherwise unremarkable. Other: No intraperitoneal free fluid. No intraperitoneal free gas. No organized fluid collection. Musculoskeletal: No abdominal wall hernia or abnormality. No suspicious lytic or blastic osseous lesions. No acute displaced fracture. Review of the MIP images confirms the above findings. IMPRESSION: 1. No pulmonary embolus. 2. No acute intrathoracic abnormality. 3. Fluid dilatation of the gastric antrum and duodenal bulb. Question redundant duodenum versus diverticula versus ulceration of the first portion of the duodenum. No bowel perforation. Given history of ulceration recommend endoscopy for further evaluation. 4. Stool throughout the majority of the colon-correlate for constipation. 5. Uterine fibroids with query thickened endometrium. Recommend pelvic ultrasound for further evaluation. 6. Indeterminate proximal to mid main pancreatic ductal dilatation. Correlate with prior cross-sectional imaging to evaluate for stability. No associated common bile duct dilatation. When the patient is  clinically stable and able to follow directions and hold their breath (preferably as an outpatient) further evaluation with dedicated MRI pancreatic protocol should be considered. Electronically Signed   By: Morgane  Naveau M.D.   On: 12/11/2023 01:46   CT ABDOMEN PELVIS W CONTRAST Result Date: 12/11/2023 CLINICAL DATA:  Pulmonary embolism (PE) suspected, high prob; Abdominal pain, acute, nonlocalized Pt c/o central chest pain that radiates around to the back. Per pt she has 3 ulcers and on medication for acid reflux. Pt states shortness of breath starting about an hour ago. Pt states attempted to lay down but pain increased. EXAM: CT ANGIOGRAPHY CHEST CT ABDOMEN AND PELVIS WITH CONTRAST TECHNIQUE: Multidetector CT imaging of the chest was performed using the standard protocol during bolus administration of intravenous contrast. Multiplanar CT image reconstructions and MIPs were obtained to evaluate the vascular anatomy. Multidetector CT imaging of the abdomen and pelvis was performed using the standard protocol  during bolus administration of intravenous contrast. RADIATION DOSE REDUCTION: This exam was performed according to the departmental dose-optimization program which includes automated exposure control, adjustment of the mA and/or kV according to patient size and/or use of iterative reconstruction technique. CONTRAST:  OMNIPAQUE  IOHEXOL  350 MG/ML SOLN COMPARISON:  None Available. FINDINGS: CTA CHEST FINDINGS Cardiovascular: Satisfactory opacification of the pulmonary arteries to the segmental level. No evidence of pulmonary embolism. Normal heart size. No significant pericardial effusion. The thoracic aorta is normal in caliber. No atherosclerotic plaque of the thoracic aorta. No coronary artery calcifications. Mediastinum/Nodes: No enlarged mediastinal, hilar, or axillary lymph nodes. Thyroid gland, trachea, and esophagus demonstrate no significant findings. Lungs/Pleura: No focal consolidation. No  pulmonary nodule. No pulmonary mass. No pleural effusion. No pneumothorax. Musculoskeletal: No chest wall abnormality. No suspicious lytic or blastic osseous lesions. No acute displaced fracture. Review of the MIP images confirms the above findings. CT ABDOMEN and PELVIS FINDINGS Hepatobiliary: Vague hypodensity along the falciform ligament likely focal fatty infiltration. No gallstones, gallbladder wall thickening, or pericholecystic fluid. No biliary dilatation. Pancreas: No focal lesion. Normal pancreatic contour. No surrounding inflammatory changes. Proximal to mid main pancreatic ductal dilatation. Spleen: Normal in size without focal abnormality. Adrenals/Urinary Tract: No adrenal nodule bilaterally. Bilateral kidneys enhance symmetrically. No hydronephrosis. No hydroureter. The urinary bladder is unremarkable. On delayed imaging, there is no urothelial wall thickening and there are no filling defects in the opacified portions of the bilateral collecting systems or ureters. Stomach/Bowel: Fluid dilatation of the gastric antrum and duodenal bulb. Question redundant duodenum versus diverticula versus ulceration of the first portion of the duodenum (5:29, 2:22). No evidence of bowel wall thickening or dilatation. Stool throughout the majority of the colon appendix appears normal. Vascular/Lymphatic: No abdominal aorta or iliac aneurysm. Mild to moderate atherosclerotic plaque of the aorta and its branches. No abdominal, pelvic, or inguinal lymphadenopathy. Reproductive: Lobulated uterine contour with enhancing uterine fibroids and query thickened endometrium. Uterus and bilateral adnexa are otherwise unremarkable. Other: No intraperitoneal free fluid. No intraperitoneal free gas. No organized fluid collection. Musculoskeletal: No abdominal wall hernia or abnormality. No suspicious lytic or blastic osseous lesions. No acute displaced fracture. Review of the MIP images confirms the above findings. IMPRESSION: 1. No  pulmonary embolus. 2. No acute intrathoracic abnormality. 3. Fluid dilatation of the gastric antrum and duodenal bulb. Question redundant duodenum versus diverticula versus ulceration of the first portion of the duodenum. No bowel perforation. Given history of ulceration recommend endoscopy for further evaluation. 4. Stool throughout the majority of the colon-correlate for constipation. 5. Uterine fibroids with query thickened endometrium. Recommend pelvic ultrasound for further evaluation. 6. Indeterminate proximal to mid main pancreatic ductal dilatation. Correlate with prior cross-sectional imaging to evaluate for stability. No associated common bile duct dilatation. When the patient is clinically stable and able to follow directions and hold their breath (preferably as an outpatient) further evaluation with dedicated MRI pancreatic protocol should be considered. Electronically Signed   By: Morgane  Naveau M.D.   On: 12/11/2023 01:46   DG Chest 2 View Result Date: 12/10/2023 CLINICAL DATA:  Chest pain. EXAM: CHEST - 2 VIEW COMPARISON:  None Available. FINDINGS: The heart size and mediastinal contours are within normal limits. There is no evidence of an acute infiltrate, pleural effusion or pneumothorax. The visualized skeletal structures are unremarkable. IMPRESSION: No active cardiopulmonary disease. Electronically Signed   By: Suzen Dials M.D.   On: 12/10/2023 21:53   ____________________________________________  PROCEDURES  Procedure(s) performed:   Procedures ____________________________________________  INITIAL IMPRESSION / ASSESSMENT AND PLAN   This patient presents to the ED for concern of epigastric/chest pain, this involves an extensive number of treatment options, and is a complaint that carries with it a high risk of complications and morbidity.  Based on exam, history of present illness I think the most likely etiology is PUD w/ possible complication, however am considering ACS,  PE, pancreatitis as well but thought to be less likely based on H&P and workup to this point.   Additional history obtained:  Additional history obtained from Husband at bedside Previous records obtained and reviewed in Epic  Co morbidities that complicate the patient evaluation  hypothyroidism  Social Determinants of Health:  Smoker Alcohol drinker but not daily and not large amounts  Initial Plan:  Treat symptoms.  Ct for PE and any e/o perforation  Screening labs including CBC and Metabolic panel to evaluate for infectious or metabolic etiology of disease.  Urinalysis with reflex culture ordered to evaluate for UTI or relevant urologic/nephrologic pathology.  CXR to evaluate for structural/infectious intrathoracic pathology.  EKG to evaluate for cardiac pathology. Objective evaluation as below reviewed with plan for close reassessment  ED Course  Images ordered viewed and obtained by myself. Agree with Radiology interpretation. Details in ED course.  Labs ordered reviewed by myself as detailed in ED course.  Consultations obtained/considered detailed in ED course.   Clinical Course as of 12/11/23 0545  Tue Dec 10, 2023  2358 Resp(!): 22 [JM]  04-Nov-2357 Potassium(!): 3.2 Possibly related to her PUD meds [JM]  11/04/57 Hemoglobin: 12.3 [JM]  2358 WBC: 9.3 [JM]  2358 Creatinine(!): 1.04 Near baseline [JM]  Nov 04, 2357 Clear on my interpretation [JM]  Wed Dec 11, 2023  0202 Lipase(!): 11-04-76 Not significantly high, will correlate to ct [JM]  0202 Troponin I (High Sensitivity): 5 reassuring [JM]  0202 CT ABDOMEN PELVIS W CONTRAST Personally viewed and interpreted by myself with some thickening of distal stomach without obvious free air. Also with constipation. Radiology read reviewed, will ensure she has recommended follow up studies on d/c paperwork.  [JM]  0203 CT Angio Chest PE W and/or Wo Contrast CTA done and showed no obvious PE, PTX or consolidations (independently viewed and  interpreted by myself and radiology read reviewed).  [JM]    Clinical Course User Index [JM] Marrietta Thunder, Selinda, MD      Cardiac Monitoring:  The patient was maintained on a cardiac monitor.  I personally viewed and interpreted the cardiac monitored which showed an underlying rhythm of: NSR  CRITICAL INTERVENTIONS:  N/a  Reevaluation:  After the interventions noted above, I reevaluated the patient and found that they have :improved  Pain free. Suspect PUD as cause. Workup reassuring. Counseled extensively on lifestyle modifications and Gi follow up.   FINAL IMPRESSION AND PLAN Final diagnoses:  Gastric ulcer without hemorrhage or perforation, unspecified chronicity  Pancreatic duct dilated  Thickened endometrium  Fibroids   A medical screening exam was performed and I feel the patient has had an appropriate workup for their chief complaint at this time and likelihood of emergent condition existing is low. They have been counseled on decision, DISCHARGE, follow up and which symptoms necessitate immediate return to the emergency department. They or their family verbally stated understanding and agreement with plan and discharged in stable condition.   ____________________________________________   NEW OUTPATIENT MEDICATIONS STARTED DURING THIS VISIT:  Discharge Medication List as of 12/11/2023  3:04 AM     START taking these medications  Details  sucralfate  (CARAFATE ) 1 GM/10ML suspension Take 10 mLs (1 g total) by mouth 4 (four) times daily -  with meals and at bedtime., Starting Wed 12/11/2023, Normal        Note:  This note was prepared with assistance of Dragon voice recognition software. Occasional wrong-word or sound-a-like substitutions may have occurred due to the inherent limitations of voice recognition software.    Disaya Walt, Selinda, MD 12/11/23 [phone]

## 2023-12-11 DIAGNOSIS — K259 Gastric ulcer, unspecified as acute or chronic, without hemorrhage or perforation: Secondary | ICD-10-CM | POA: Diagnosis not present

## 2023-12-11 LAB — HEPATIC FUNCTION PANEL
ALT: 13 U/L (ref 0–44)
AST: 19 U/L (ref 15–41)
Albumin: 3.6 g/dL (ref 3.5–5.0)
Alkaline Phosphatase: 63 U/L (ref 38–126)
Bilirubin, Direct: <0.1 mg/dL (ref 0.0–0.2)
Total Bilirubin: 0.5 mg/dL (ref 0.0–1.2)
Total Protein: 6.8 g/dL (ref 6.5–8.1)

## 2023-12-11 LAB — TROPONIN I (HIGH SENSITIVITY): Troponin I (High Sensitivity): 5 ng/L (ref <18)

## 2023-12-11 LAB — LIPASE, BLOOD: Lipase: 77 U/L — ABNORMAL HIGH (ref 11–51)

## 2023-12-11 MED ORDER — SUCRALFATE 1 GM/10ML PO SUSP: 1.0000 g | Freq: Three times a day (TID) | ORAL | 0 refills | Status: DC

## 2023-12-12 ENCOUNTER — Encounter: Payer: Self-pay | Admitting: Internal Medicine

## 2023-12-12 ENCOUNTER — Ambulatory Visit (INDEPENDENT_AMBULATORY_CARE_PROVIDER_SITE_OTHER): Payer: Medicare Other | Admitting: Internal Medicine

## 2023-12-12 VITALS — BP 118/74 | HR 73 | Ht 66.0 in | Wt 135.8 lb

## 2023-12-12 DIAGNOSIS — Z09 Encounter for follow-up examination after completed treatment for conditions other than malignant neoplasm: Secondary | ICD-10-CM | POA: Diagnosis not present

## 2023-12-12 DIAGNOSIS — K279 Peptic ulcer, site unspecified, unspecified as acute or chronic, without hemorrhage or perforation: Secondary | ICD-10-CM | POA: Diagnosis not present

## 2023-12-12 DIAGNOSIS — E039 Hypothyroidism, unspecified: Secondary | ICD-10-CM | POA: Diagnosis not present

## 2023-12-12 MED ORDER — SUCRALFATE 1 G PO TABS: 1.0000 g | ORAL_TABLET | Freq: Three times a day (TID) | ORAL | 0 refills | Status: DC

## 2023-12-12 NOTE — Assessment & Plan Note (Signed)
ER chart reviewed, including blood tests and EKG Epigastric pain from GERD Incidental findings of ?pancreatic duct dilatation, advised to discuss with GI for need of MRI pancreas

## 2023-12-12 NOTE — Progress Notes (Signed)
Established Patient Office Visit  Subjective:  Patient ID: Ann Reeves, female    DOB: 1955/12/28  Age: 68 y.o. MRN: [id number]  CC:  Chief Complaint  Patient presents with   Follow-up    Pt f/u from ED visit on 12/10/23, heart burn is better since Tuesday, but still having issues with this. Wasn't able to pick up sucralfate due to it being sent in as liquid rather than pill form.     HPI Ann Reeves is a 68 y.o. female with past medical history of GERD, hypothyroidism and sciatica who presents for f/u of recent ER visit for PUD.  She was having worsening of epigastric pain in the last 2 weeks, went to ER on 12/10/23 and had CT abdomen pelvis with contrast, which showed possible duodenal ulceration.  She was given sucralfate liquid, which she could not get due to insurance coverage concern.  She has been prescribed occasional by GI for resistant PUD, but she does not take it daily.  She still reports epigastric discomfort.  Denies any acute abdominal pain now. She denies melena or hematochezia.  Denies nausea or vomiting currently.  Hypothyroidism: She has started taking levothyroxine 150 mcg QD now instead of 200 mcg.  Denies any tremors or palpitations currently.  Past Medical History:  Diagnosis Date   GERD (gastroesophageal reflux disease)    Helicobacter pylori gastritis 02/2021   Treated with Prevpac.  Documented eradication in December 2022.   Hypertension    Hyperthyroidism- had goiter removed in 1998    Mixed hyperlipidemia 08/27/2022    Past Surgical History:  Procedure Laterality Date   BIOPSY  02/27/2021   Procedure: BIOPSY;  Surgeon: Lanelle Bal, DO;  [facility];  Service: Endoscopy;;  gastric   COLONOSCOPY WITH PROPOFOL N/A 01/21/2023   Procedure: COLONOSCOPY WITH PROPOFOL;  Surgeon: Lanelle Bal, DO;  [facility]: [facility];  Service: Endoscopy;  Laterality: N/A;  900am, asa 2   ESOPHAGOGASTRODUODENOSCOPY (EGD) WITH PROPOFOL N/A  02/27/2021   Surgeon: Lanelle Bal, DO; H. pylori gastritis.  Treated with Prevpac.   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 [place]  Tobacco Use   Smoking status: Some Days    Current packs/day: 0.50    Average packs/day: 0.5 packs/day for 10.0 years (5.0 ttl pk-yrs)    Types: Cigarettes    Passive exposure: Current   Smokeless tobacco: Never  Vaping Use   Vaping status: Never Used  Substance and Sexual Activity   Alcohol use: Yes    Alcohol/week: 2.0 standard drinks of alcohol    Types: 2 Glasses of wine per week    Comment: occ   Drug use: Never   Sexual activity: Yes    Birth control/protection: Post-menopausal  Other Topics Concern   Not on file  Social History Narrative   Son lives in [place]; family lives in [place], moved to [place] at age 2  Social Drivers of Corporate investment banker Strain: Low Risk  (09/11/2023)   Overall Financial Resource Strain (CARDIA)    Difficulty of Paying Living Expenses: Not hard at all  Food Insecurity: No Food Insecurity (09/11/2023)   Hunger Vital Sign    Worried About Running Out of Food in the Last Year: Never true    Ran Out of Food in the Last Year: Never true  Transportation Needs: No Transportation Needs (09/11/2023)   PRAPARE - Administrator, Civil Service (Medical): No    Lack of Transportation (Non-Medical): No  Physical Activity: Insufficiently Active (09/11/2023)   Exercise Vital Sign    Days of Exercise per Week: 2 days     Minutes of Exercise per Session: 20 min  Stress: No Stress Concern Present (09/11/2023)   [employer] of Occupational Health - Occupational Stress Questionnaire    Feeling of Stress : Only a little  Social Connections: Moderately Integrated (09/11/2023)   Social Connection and Isolation Panel [NHANES]    Frequency of Communication with Friends and Family: More than three times a week    Frequency of Social Gatherings with Friends and Family: More than three times a week    Attends Religious Services: More than 4 times per year    Active Member of [employer] or Organizations: No    Attends Banker Meetings: Never    Marital Status: Married  Catering manager Violence: Not At Risk (09/11/2023)   Humiliation, Afraid, Rape, and Kick questionnaire    Fear of Current or Ex-Partner: No    Emotionally Abused: No    Physically Abused: No    Sexually Abused: No    Outpatient Medications Prior to Visit  Medication Sig Dispense Refill   acetaminophen (TYLENOL) 500 MG tablet Take 1,000 mg by mouth every 6 (six) hours as needed for moderate pain.     albuterol (VENTOLIN HFA) 108 (90 Base) MCG/ACT inhaler INHALE 2 PUFFS BY MOUTH EVERY 6 HOURS AS NEEDED FOR WHEEZING OR SHORTNESS OF BREATH 18 g 0   calcium carbonate (TUMS - DOSED IN MG ELEMENTAL CALCIUM) 500 MG chewable tablet Chew 2 tablets by mouth daily as needed for indigestion or heartburn.     cholecalciferol (VITAMIN D) 25 MCG (1000 UNIT) tablet Take 2,000 Units by mouth daily.     levothyroxine (SYNTHROID) 150 MCG tablet Take 150 mcg by mouth daily before breakfast.     ondansetron (ZOFRAN) 4 MG tablet Take 1 tablet (4 mg total) by mouth every 6 (six) hours. As needed for nausea/vomiting 12 tablet 0   Vonoprazan Fumarate (VOQUEZNA) 20 MG TABS Take 1 tablet by mouth daily. 30 tablet 11   sucralfate (CARAFATE) 1 GM/10ML suspension Take 10 mLs (1 g total) by mouth 4 (four) times daily -  with meals and at bedtime. 420 mL 0   No  facility-administered medications prior to visit.    No Known Allergies  ROS Review of Systems  Constitutional:  Negative for chills and fever.  HENT:  Negative for congestion, sinus pressure, sinus pain and sore throat.   Eyes:  Negative for pain and discharge.  Respiratory:  Negative for cough and shortness of breath.   Cardiovascular:  Negative for chest pain and palpitations.  Gastrointestinal:  Positive for abdominal pain (Epigastric, burning) and constipation. Negative for diarrhea, nausea and vomiting.  Endocrine: Negative for polydipsia and polyuria.  Genitourinary:  Negative for dysuria and hematuria.  Musculoskeletal:  Positive for arthralgias (Right shoulder). Negative for  neck pain and neck stiffness.  Skin:  Negative for rash.  Neurological:  Negative for dizziness and weakness.  Psychiatric/Behavioral:  Negative for agitation and behavioral problems.       Objective:    Physical Exam Vitals reviewed.  Constitutional:      General: She is not in acute distress.    Appearance: She is not diaphoretic.  HENT:     Head: Normocephalic and atraumatic.     Nose: Nose normal. No congestion.     Mouth/Throat:     Mouth: Mucous membranes are moist.     Pharynx: No posterior oropharyngeal erythema.  Eyes:     General: No scleral icterus.    Extraocular Movements: Extraocular movements intact.  Neck:     Thyroid: No thyromegaly.  Cardiovascular:     Rate and Rhythm: Normal rate and regular rhythm.     Pulses: Normal pulses.     Heart sounds: Normal heart sounds. No murmur heard. Pulmonary:     Breath sounds: Normal breath sounds. No wheezing or rales.  Musculoskeletal:     Right shoulder: Tenderness (Anterior part) present.     Cervical back: Neck supple. No tenderness.     Right lower leg: No edema.     Left lower leg: No edema.     Comments: ROM and right shoulder painful  Skin:    General: Skin is warm.     Findings: No rash.  Neurological:     General: No  focal deficit present.     Mental Status: She is alert and oriented to person, place, and time.  Psychiatric:        Mood and Affect: Mood normal.        Behavior: Behavior normal.     BP 118/74   Pulse 73   Ht 5\' 6"  (1.676 m)   Wt 135 lb 12.8 oz (61.6 kg)   SpO2 98%   BMI 21.92 kg/m  Wt Readings from Last 3 Encounters:  12/12/23 135 lb 12.8 oz (61.6 kg)  12/10/23 132 lb 11.5 oz (60.2 kg)  10/02/23 132 lb 12.8 oz (60.2 kg)    Lab Results  Component Value Date   TSH 0.273 (L) 10/02/2023   Lab Results  Component Value Date   WBC 9.3 12/10/2023   HGB 12.3 12/10/2023   HCT 37.6 12/10/2023   MCV 92.2 12/10/2023   PLT 253 12/10/2023   Lab Results  Component Value Date   NA 136 12/10/2023   K 3.2 (L) 12/10/2023   CO2 25 12/10/2023   GLUCOSE 105 (H) 12/10/2023   BUN 22 12/10/2023   CREATININE 1.04 (H) 12/10/2023   BILITOT 0.5 12/10/2023   ALKPHOS 63 12/10/2023   AST 19 12/10/2023   ALT 13 12/10/2023   PROT 6.8 12/10/2023   ALBUMIN 3.6 12/10/2023   CALCIUM 7.8 (L) 12/10/2023   ANIONGAP 7 12/10/2023   EGFR 73 06/05/2023   Lab Results  Component Value Date   CHOL 197 01/09/2023   Lab Results  Component Value Date   HDL 57 01/09/2023   Lab Results  Component Value Date   LDLCALC 123 (H) 01/09/2023   Lab Results  Component Value Date   TRIG 94 01/09/2023   Lab Results  Component Value Date   CHOLHDL 3.5 01/09/2023   No results found for: "HGBA1C"    Assessment & Plan:   Problem List Items Addressed This Visit       Digestive   PUD (peptic ulcer disease) -  Primary   Has tried omeprazole, Dexilant and rabeprazole Needs to avoid NSAIDs, advised to take Tylenol or Voltaren gel for arthritis Followed by Dr. Marletta Lor - on Voquezna, needs to take it regularly, advised to follow up with GI Recent ER visit for resistant acid reflux/epigastric pain - started Sucralfate 1 gm tablets TID and at bedtime for 5 days and then PRN      Relevant Medications    sucralfate (CARAFATE) 1 g tablet     Endocrine   Acquired hypothyroidism   Lab Results  Component Value Date   TSH 0.273 (L) 10/02/2023    Oversupplemented On levothyroxine 150 mcg QD, but had been taking 200 mcg dose due to pharmacy refill error Check TSH, free T4, free T3, adjust dose of levothyroxine based on blood tests        Other   Encounter for examination following treatment at hospital   ER chart reviewed, including blood tests and EKG Epigastric pain from GERD Incidental findings of ?pancreatic duct dilatation, advised to discuss with GI for need of MRI pancreas       Meds ordered this encounter  Medications   sucralfate (CARAFATE) 1 g tablet    Sig: Take 1 tablet (1 g total) by mouth 4 (four) times daily -  with meals and at bedtime.    Dispense:  120 tablet    Refill:  0    Follow-up: Return if symptoms worsen or fail to improve.    Anabel Halon, MD

## 2023-12-12 NOTE — Assessment & Plan Note (Signed)
Has tried omeprazole, Dexilant and rabeprazole Needs to avoid NSAIDs, advised to take Tylenol or Voltaren gel for arthritis Followed by Dr. Marletta Lor - on Voquezna, needs to take it regularly, advised to follow up with GI Recent ER visit for resistant acid reflux/epigastric pain - started Sucralfate 1 gm tablets TID and at bedtime for 5 days and then PRN

## 2023-12-12 NOTE — Assessment & Plan Note (Signed)
Lab Results  Component Value Date   TSH 0.273 (L) 10/02/2023    Oversupplemented On levothyroxine 150 mcg QD, but had been taking 200 mcg dose due to pharmacy refill error Check TSH, free T4, free T3, adjust dose of levothyroxine based on blood tests

## 2023-12-12 NOTE — Patient Instructions (Signed)
Please start taking Sucralfate 3 times before meals and at bedtime for 5 days, and then as needed for acid reflux.  Please continue taking Voquezna as prescribed.  Please follow up with GI for gastritis/stomach ulcer.

## 2023-12-16 ENCOUNTER — Other Ambulatory Visit: Payer: Self-pay | Admitting: Internal Medicine

## 2023-12-16 DIAGNOSIS — E039 Hypothyroidism, unspecified: Secondary | ICD-10-CM

## 2023-12-23 ENCOUNTER — Other Ambulatory Visit: Payer: Self-pay | Admitting: Internal Medicine

## 2023-12-25 ENCOUNTER — Other Ambulatory Visit: Payer: Self-pay | Admitting: Internal Medicine

## 2023-12-25 NOTE — Telephone Encounter (Signed)
Please schedule pt for appt before she can have anymore refills.

## 2023-12-26 NOTE — Telephone Encounter (Signed)
Pt has been scheduled.

## 2023-12-27 ENCOUNTER — Ambulatory Visit: Payer: Medicare Other | Admitting: Gastroenterology

## 2023-12-31 NOTE — Progress Notes (Signed)
 Referring Provider: Tobie Suzzane POUR, MD Primary Care Physician:  Tobie Suzzane POUR, MD Primary GI Physician: Dr. Cindie  Chief Complaint  Patient presents with   Medication Refill    Medication refill. No problems     HPI:   Ann Reeves is a 68 y.o. female with history of GERD, H. pylori gastritis April 2022 treated with Prevpac with documented eradication, chronic epigastric pain, constipation, presenting today for follow-up/medication refills.   Recently seen in the emergency room 12/10/2023 with complaint of epigastric and chest pain.  Associated nausea without vomiting.  LFTs and troponins normal.  No leukocytosis or anemia.  Lipase was mildly elevated at 77.  CT angio chest was negative.  CT A/P with contrast showed fluid dilation of the gastric antrum and duodenal bulb with question of redundant duodenum versus diverticula versus ulceration of the first portion of the duodenum.  Stool throughout the majority of the colon.  Uterine fibroids, query thickened endometrium.  Indeterminant proximal to mid pancreatic duct dilation.  No associated CBD dilation.  Recommended outpatient dedicated MRI with pancreatic protocol.  She was given a GI cocktail along with opioid pain medication with clinical improvement.  Recommended outpatient GI follow-up and was prescribed Carafate  4 times daily.   Today: Was out of medication in [place]  for 3 weeks in January and thinks this is what caused her severe symptoms that led to ER evaluation.  Heartburn is still every day, but not as intense. Breakthrough after dinner. Taking voquezna  at bedtime.  Also taking carafate  twice a day since ER evaluation.  Feels Voquezna  has worked the best for her on her all other PPIs that have been tried. Has been on Dexilant , omeprazole, pantoprazole , Nexium OTC, rabeprazole .    No nausea or vomiting. Mild epigastric pain started in October. Comes and goes, more present in the afternoon when having heartburn. Has some  burning in her chest that will radiate around to her back. This has been going on for over a year.   NSAIDs: None.    No Fhx of pancreatitis or pancreatic cancer.   Constipation is controlled with stool softeners.  Prior GI evaluation: RUQ ultrasound 12/14/2021 with normal exam  HIDA 01/02/2022 with normal exam  Gastric emptying study 07/25/2022 with normal exam  EGD 02/27/21: Gastritis biopsied Otherwise normal exam. Pathology with H. Pylori  Colonoscopy 01/21/2023: Three 4-6 mm polyps resected and retrieved from the transverse colon, stool in the entire colon which was cleared with fair visualization. Pathology with tubular adenoma. Recommended 5-year surveillance.   Past Medical History:  Diagnosis Date   GERD (gastroesophageal reflux disease)    Helicobacter pylori gastritis 02/2021   Treated with Prevpac.  Documented eradication in December 2022.   Hypertension    Hyperthyroidism- had goiter removed in 1998    Mixed hyperlipidemia 08/27/2022    Past Surgical History:  Procedure Laterality Date   BIOPSY  02/27/2021   Procedure: BIOPSY;  Surgeon: Cindie Carlin POUR, DO;  [facility];  Service: Endoscopy;;  gastric   COLONOSCOPY WITH PROPOFOL  N/A 01/21/2023   Procedure: COLONOSCOPY WITH PROPOFOL ;  Surgeon: Cindie Carlin POUR, DO;  [facility];  Service: Endoscopy;  Laterality: N/A;  900am, asa 2   ESOPHAGOGASTRODUODENOSCOPY (EGD) WITH PROPOFOL  N/A 02/27/2021   Surgeon: Cindie Carlin POUR, DO; H. pylori gastritis.  Treated with Prevpac.   POLYPECTOMY  01/21/2023   Procedure: POLYPECTOMY;  Surgeon: Cindie Carlin POUR, DO;  [facility];  Service: Endoscopy;;  THYROID SURGERY      Current Outpatient Medications  Medication Sig Dispense Refill   acetaminophen  (TYLENOL ) 500 MG tablet Take 1,000 mg by mouth every 6 (six) hours as needed for moderate pain.     albuterol  (VENTOLIN  HFA) 108 (90 Base) MCG/ACT inhaler INHALE 2 PUFFS BY MOUTH EVERY 6  HOURS AS NEEDED FOR WHEEZING OR SHORTNESS OF BREATH 18 g 0   calcium  carbonate (TUMS - DOSED IN MG ELEMENTAL CALCIUM ) 500 MG chewable tablet Chew 2 tablets by mouth daily as needed for indigestion or heartburn.     cholecalciferol (VITAMIN D ) 25 MCG (1000 UNIT) tablet Take 2,000 Units by mouth daily.     levothyroxine  (SYNTHROID ) 150 MCG tablet TAKE 1 TABLET BY MOUTH ONCE DAILY BEFORE BREAKFAST - DOSE CHANGE 90 tablet 0   ondansetron  (ZOFRAN ) 4 MG tablet Take 1 tablet (4 mg total) by mouth every 6 (six) hours. As needed for nausea/vomiting 12 tablet 0   sucralfate  (CARAFATE ) 1 g tablet Take 1 tablet (1 g total) by mouth 4 (four) times daily -  with meals and at bedtime. 120 tablet 0   Vonoprazan Fumarate  (VOQUEZNA ) 20 MG TABS Take 1 tablet by mouth daily. 30 tablet 5   No current facility-administered medications for this visit.    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[place]  Tobacco Use   Smoking status: Some Days    Current packs/day: 0.50    Average packs/day: 0.5 packs/day for 10.0 years (5.0 ttl pk-yrs)    Types: Cigarettes    Passive exposure: Current   Smokeless tobacco: Never  Vaping Use   Vaping status: Never Used  Substance and Sexual Activity   Alcohol use: Yes    Alcohol/week: 2.0 standard drinks of alcohol    Types: 2 Glasses of wine per week    Comment: occ   Drug use: Never   Sexual activity: Yes    Birth control/protection: Post-menopausal  Other Topics Concern   Not on file  Social History Narrative   Son lives in [place];  family lives in [place], moved to [place] at age 14   Social Drivers of Health   Financial Resource Strain: Low Risk  (09/11/2023)   Overall Financial Resource Strain (CARDIA)    Difficulty of Paying Living Expenses: Not hard at all  Food Insecurity: No Food Insecurity (09/11/2023)   Hunger Vital Sign    Worried About Running Out of Food in the Last Year: Never true    Ran Out of Food in the Last Year: Never true  Transportation Needs: No Transportation Needs (09/11/2023)   PRAPARE - Administrator, Civil Service (Medical): No    Lack of Transportation (Non-Medical): No  Physical Activity: Insufficiently Active (09/11/2023)   Exercise Vital Sign    Days of Exercise per Week: 2 days    Minutes of Exercise per Session: 20 min  Stress: No Stress Concern Present (09/11/2023)   [employer]  of Occupational Health - Occupational Stress Questionnaire    Feeling of Stress : Only a little  Social Connections: Moderately Integrated (09/11/2023)   Social Connection and Isolation Panel [NHANES]    Frequency of Communication with Friends and Family: More than three times a week    Frequency of Social Gatherings with Friends and Family: More than three times a week    Attends Religious Services: More than 4 times per year    Active Member of [employer] or Organizations: No    Attends Banker Meetings: Never    Marital Status: Married    Review of Systems: Gen: Denies fever, chills.  Currently with nasal congestion. CV: Denies chest pain, palpitations. Resp: Denies dyspnea, cough. GI: See HPI Heme: See HPI  Physical Exam: BP 121/66 (BP Location: Right Arm, Patient Position: Sitting, Cuff Size: Normal)   Pulse 76   Temp 97.6 F (36.4 C) (Temporal)   Ht 5' 6 (1.676 m)   Wt 139 lb (63 kg)   BMI 22.44 kg/m  General:   Alert and oriented. No distress noted. Pleasant and cooperative.  Head:  Normocephalic and atraumatic. Eyes:  Conjuctiva clear without scleral  icterus. Heart:  S1, S2 present without murmurs appreciated. Lungs:  Clear to auscultation bilaterally. No wheezes, rales, or rhonchi. No distress.  Abdomen:  +BS, soft, and non-distended.  Mild epigastric and LUQ tenderness.  No rebound or guarding. No HSM or masses noted. Msk:  Symmetrical without gross deformities. Normal posture. Extremities:  Without edema. Neurologic:  Alert and  oriented x4 Psych:  Normal mood and affect.    Assessment:  68 y.o. female with history of GERD, H. pylori gastritis April 2022 treated with Prevpac with documented eradication, chronic epigastric pain, constipation,  presenting today for follow-up/medication refills.   GERD:  Improved with Voquezna , but still with breakthrough symptoms in the evening most every day. Notably, patient is taking Voquezna  at bedtime. Previously failed Dexilant , omeprazole, pantoprazole , Nexium OTC, rabeprazole  and feels she has had the most improvement with Voquezna . Recommended taking in the morning before breakfast and adding pepcid  in the evening if needed.   Abnormal CT small bowel: Recent CT A/P with contrast 12/10/2023 showed fluid dilation of the gastric antrum and duodenal bulb with question of redundant duodenum versus diverticular versus ulceration of the first portion of the duodenum.  Patient does have epigastric pain which has been a chronic problem for her, but reports newer onset since October 2024 though it seems to be closely associated with breakthrough GERD.  Denies NSAIDs.  Considering her last EGD was in April 2022, we will update an EGD to evaluate abnormal CT findings.  Advised to continue Voquezna  daily and take Carafate  4 times daily as recently prescribed by the ER until EGD is performed.  Pancreatic duct dilation: CT A/P with contrast 12/10/2023 showed indeterminate proximal and mid pancreatic duct dilation.  No associated CBD dilation.  She does have epigastric pain which has been a chronic problem for her,  but reporting neurosymptoms since October 2024.  Seems to be more related to GERD.  No personal history of pancreatitis.  No family history of pancreatitis or pancreatic cancer.  Will proceed with MRI/MRCP with pancreatic protocol for further evaluation.   Plan:  MRI/MRCP with and without contrast Proceed with upper endoscopy with propofol  by Dr. Cindie in near future. The risks, benefits, and alternatives have been discussed with the patient in detail. The patient states understanding and desires to proceed.  ASA 2 Voquezna   20 mg in the morning 30 minutes before breakfast Carafate  1 g 3 times daily before meals and at bedtime.  Dissolve the 1 g tablet into 15-30 mL of water , then drink the slurry. If heartburn continues in the evening, take Pepcid  20 mg in the evening.  Follow-up after EGD.    Josette Centers, PA-C [facility] 01/02/2024

## 2023-12-31 NOTE — H&P (View-Only) (Signed)
 Referring Provider: Tobie Suzzane POUR, MD Primary Care Physician:  Tobie Suzzane POUR, MD Primary GI Physician: Dr. Cindie  Chief Complaint  Patient presents with   Medication Refill    Medication refill. No problems     HPI:   Ann Reeves is a 68 y.o. female with history of GERD, H. pylori gastritis April 2022 treated with Prevpac with documented eradication, chronic epigastric pain, constipation, presenting today for follow-up/medication refills.   Recently seen in the emergency room 12/10/2023 with complaint of epigastric and chest pain.  Associated nausea without vomiting.  LFTs and troponins normal.  No leukocytosis or anemia.  Lipase was mildly elevated at 77.  CT angio chest was negative.  CT A/P with contrast showed fluid dilation of the gastric antrum and duodenal bulb with question of redundant duodenum versus diverticula versus ulceration of the first portion of the duodenum.  Stool throughout the majority of the colon.  Uterine fibroids, query thickened endometrium.  Indeterminant proximal to mid pancreatic duct dilation.  No associated CBD dilation.  Recommended outpatient dedicated MRI with pancreatic protocol.  She was given a GI cocktail along with opioid pain medication with clinical improvement.  Recommended outpatient GI follow-up and was prescribed Carafate  4 times daily.   Today: Was out of medication in [place]  for 3 weeks in January and thinks this is what caused her severe symptoms that led to ER evaluation.  Heartburn is still every day, but not as intense. Breakthrough after dinner. Taking voquezna  at bedtime.  Also taking carafate  twice a day since ER evaluation.  Feels Voquezna  has worked the best for her on her all other PPIs that have been tried. Has been on Dexilant , omeprazole, pantoprazole , Nexium OTC, rabeprazole .    No nausea or vomiting. Mild epigastric pain started in October. Comes and goes, more present in the afternoon when having heartburn. Has some  burning in her chest that will radiate around to her back. This has been going on for over a year.   NSAIDs: None.    No Fhx of pancreatitis or pancreatic cancer.   Constipation is controlled with stool softeners.  Prior GI evaluation: RUQ ultrasound 12/14/2021 with normal exam  HIDA 01/02/2022 with normal exam  Gastric emptying study 07/25/2022 with normal exam  EGD 02/27/21: Gastritis biopsied Otherwise normal exam. Pathology with H. Pylori  Colonoscopy 01/21/2023: Three 4-6 mm polyps resected and retrieved from the transverse colon, stool in the entire colon which was cleared with fair visualization. Pathology with tubular adenoma. Recommended 5-year surveillance.   Past Medical History:  Diagnosis Date   GERD (gastroesophageal reflux disease)    Helicobacter pylori gastritis 02/2021   Treated with Prevpac.  Documented eradication in December 2022.   Hypertension    Hyperthyroidism- had goiter removed in 1998    Mixed hyperlipidemia 08/27/2022    Past Surgical History:  Procedure Laterality Date   BIOPSY  02/27/2021   Procedure: BIOPSY;  Surgeon: Cindie Carlin POUR, DO;  [facility];  Service: Endoscopy;;  gastric   COLONOSCOPY WITH PROPOFOL  N/A 01/21/2023   Procedure: COLONOSCOPY WITH PROPOFOL ;  Surgeon: Cindie Carlin POUR, DO;  Location: [facility];  Service: Endoscopy;  Laterality: N/A;  900am, asa 2   ESOPHAGOGASTRODUODENOSCOPY (EGD) WITH PROPOFOL  N/A 02/27/2021   Surgeon: Cindie Carlin POUR, DO; H. pylori gastritis.  Treated with Prevpac.   POLYPECTOMY  01/21/2023   Procedure: POLYPECTOMY;  Surgeon: Cindie Carlin POUR, DO;  Location: [facility];  Service: Endoscopy;;  THYROID SURGERY      Current Outpatient Medications  Medication Sig Dispense Refill   acetaminophen  (TYLENOL ) 500 MG tablet Take 1,000 mg by mouth every 6 (six) hours as needed for moderate pain.     albuterol  (VENTOLIN  HFA) 108 (90 Base) MCG/ACT inhaler INHALE 2 PUFFS BY MOUTH EVERY 6  HOURS AS NEEDED FOR WHEEZING OR SHORTNESS OF BREATH 18 g 0   calcium  carbonate (TUMS - DOSED IN MG ELEMENTAL CALCIUM ) 500 MG chewable tablet Chew 2 tablets by mouth daily as needed for indigestion or heartburn.     cholecalciferol (VITAMIN D ) 25 MCG (1000 UNIT) tablet Take 2,000 Units by mouth daily.     levothyroxine  (SYNTHROID ) 150 MCG tablet TAKE 1 TABLET BY MOUTH ONCE DAILY BEFORE BREAKFAST - DOSE CHANGE 90 tablet 0   ondansetron  (ZOFRAN ) 4 MG tablet Take 1 tablet (4 mg total) by mouth every 6 (six) hours. As needed for nausea/vomiting 12 tablet 0   sucralfate  (CARAFATE ) 1 g tablet Take 1 tablet (1 g total) by mouth 4 (four) times daily -  with meals and at bedtime. 120 tablet 0   Vonoprazan Fumarate  (VOQUEZNA ) 20 MG TABS Take 1 tablet by mouth daily. 30 tablet 5   No current facility-administered medications for this visit.    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[place]  Tobacco Use   Smoking status: Some Days    Current packs/day: 0.50    Average packs/day: 0.5 packs/day for 10.0 years (5.0 ttl pk-yrs)    Types: Cigarettes    Passive exposure: Current   Smokeless tobacco: Never  Vaping Use   Vaping status: Never Used  Substance and Sexual Activity   Alcohol use: Yes    Alcohol/week: 2.0 standard drinks of alcohol    Types: 2 Glasses of wine per week    Comment: occ   Drug use: Never   Sexual activity: Yes    Birth control/protection: Post-menopausal  Other Topics Concern   Not on file  Social History Narrative   Son lives in [place];  family lives in [place], moved to [place] at age 14   Social Drivers of Health   Financial Resource Strain: Low Risk  (09/11/2023)   Overall Financial Resource Strain (CARDIA)    Difficulty of Paying Living Expenses: Not hard at all  Food Insecurity: No Food Insecurity (09/11/2023)   Hunger Vital Sign    Worried About Running Out of Food in the Last Year: Never true    Ran Out of Food in the Last Year: Never true  Transportation Needs: No Transportation Needs (09/11/2023)   PRAPARE - Administrator, Civil Service (Medical): No    Lack of Transportation (Non-Medical): No  Physical Activity: Insufficiently Active (09/11/2023)   Exercise Vital Sign    Days of Exercise per Week: 2 days    Minutes of Exercise per Session: 20 min  Stress: No Stress Concern Present (09/11/2023)   [employer]  of Occupational Health - Occupational Stress Questionnaire    Feeling of Stress : Only a little  Social Connections: Moderately Integrated (09/11/2023)   Social Connection and Isolation Panel [NHANES]    Frequency of Communication with Friends and Family: More than three times a week    Frequency of Social Gatherings with Friends and Family: More than three times a week    Attends Religious Services: More than 4 times per year    Active Member of [employer] or Organizations: No    Attends Banker Meetings: Never    Marital Status: Married    Review of Systems: Gen: Denies fever, chills.  Currently with nasal congestion. CV: Denies chest pain, palpitations. Resp: Denies dyspnea, cough. GI: See HPI Heme: See HPI  Physical Exam: BP 121/66 (BP Location: Right Arm, Patient Position: Sitting, Cuff Size: Normal)   Pulse 76   Temp 97.6 F (36.4 C) (Temporal)   Ht 5' 6 (1.676 m)   Wt 139 lb (63 kg)   BMI 22.44 kg/m  General:   Alert and oriented. No distress noted. Pleasant and cooperative.  Head:  Normocephalic and atraumatic. Eyes:  Conjuctiva clear without scleral  icterus. Heart:  S1, S2 present without murmurs appreciated. Lungs:  Clear to auscultation bilaterally. No wheezes, rales, or rhonchi. No distress.  Abdomen:  +BS, soft, and non-distended.  Mild epigastric and LUQ tenderness.  No rebound or guarding. No HSM or masses noted. Msk:  Symmetrical without gross deformities. Normal posture. Extremities:  Without edema. Neurologic:  Alert and  oriented x4 Psych:  Normal mood and affect.    Assessment:  68 y.o. female with history of GERD, H. pylori gastritis April 2022 treated with Prevpac with documented eradication, chronic epigastric pain, constipation,  presenting today for follow-up/medication refills.   GERD:  Improved with Voquezna , but still with breakthrough symptoms in the evening most every day. Notably, patient is taking Voquezna  at bedtime. Previously failed Dexilant , omeprazole, pantoprazole , Nexium OTC, rabeprazole  and feels she has had the most improvement with Voquezna . Recommended taking in the morning before breakfast and adding pepcid  in the evening if needed.   Abnormal CT small bowel: Recent CT A/P with contrast 12/10/2023 showed fluid dilation of the gastric antrum and duodenal bulb with question of redundant duodenum versus diverticular versus ulceration of the first portion of the duodenum.  Patient does have epigastric pain which has been a chronic problem for her, but reports newer onset since October 2024 though it seems to be closely associated with breakthrough GERD.  Denies NSAIDs.  Considering her last EGD was in April 2022, we will update an EGD to evaluate abnormal CT findings.  Advised to continue Voquezna  daily and take Carafate  4 times daily as recently prescribed by the ER until EGD is performed.  Pancreatic duct dilation: CT A/P with contrast 12/10/2023 showed indeterminate proximal and mid pancreatic duct dilation.  No associated CBD dilation.  She does have epigastric pain which has been a chronic problem for her,  but reporting neurosymptoms since October 2024.  Seems to be more related to GERD.  No personal history of pancreatitis.  No family history of pancreatitis or pancreatic cancer.  Will proceed with MRI/MRCP with pancreatic protocol for further evaluation.   Plan:  MRI/MRCP with and without contrast Proceed with upper endoscopy with propofol  by Dr. Cindie in near future. The risks, benefits, and alternatives have been discussed with the patient in detail. The patient states understanding and desires to proceed.  ASA 2 Voquezna   20 mg in the morning 30 minutes before breakfast Carafate  1 g 3 times daily before meals and at bedtime.  Dissolve the 1 g tablet into 15-30 mL of water , then drink the slurry. If heartburn continues in the evening, take Pepcid  20 mg in the evening.  Follow-up after EGD.    Josette Centers, PA-C [facility] 01/02/2024

## 2024-01-02 ENCOUNTER — Encounter: Payer: Self-pay | Admitting: *Deleted

## 2024-01-02 ENCOUNTER — Encounter: Payer: Self-pay | Admitting: Gastroenterology

## 2024-01-02 ENCOUNTER — Ambulatory Visit (INDEPENDENT_AMBULATORY_CARE_PROVIDER_SITE_OTHER): Payer: Medicare Other | Admitting: Gastroenterology

## 2024-01-02 VITALS — BP 121/66 | HR 76 | Temp 97.6°F | Ht 66.0 in | Wt 139.0 lb

## 2024-01-02 DIAGNOSIS — K219 Gastro-esophageal reflux disease without esophagitis: Secondary | ICD-10-CM

## 2024-01-02 DIAGNOSIS — R1013 Epigastric pain: Secondary | ICD-10-CM

## 2024-01-02 DIAGNOSIS — K8689 Other specified diseases of pancreas: Secondary | ICD-10-CM

## 2024-01-02 DIAGNOSIS — R933 Abnormal findings on diagnostic imaging of other parts of digestive tract: Secondary | ICD-10-CM

## 2024-01-02 MED ORDER — VOQUEZNA 20 MG PO TABS: 1.0000 | ORAL_TABLET | Freq: Every day | ORAL | 5 refills | Status: DC

## 2024-01-02 NOTE — Patient Instructions (Addendum)
 We will get you scheduled for an upper endoscopy in the near future with Dr. Brinda.  Will also be scheduled for an MRI of your abdomen to take another look at your pancreatic ducts.  Take Voquezna  20 mg in the morning time 30 minutes before breakfast.  Take Carafate  1 g 3 times daily before meals and at bedtime.  Dissolve the 1 g tablet into 15-30 mL of water , then drink the slurry.  If you still have breakthrough heartburn symptoms in the evening, you can start taking Pepcid  20 mg in the evening.   We will see you back after your upper endoscopy.

## 2024-01-05 ENCOUNTER — Other Ambulatory Visit: Payer: Self-pay | Admitting: Gastroenterology

## 2024-01-05 ENCOUNTER — Ambulatory Visit (HOSPITAL_COMMUNITY)
Admission: RE | Admit: 2024-01-05 | Discharge: 2024-01-05 | Disposition: A | Discharge status: Home / Self Care | Payer: Medicare Other | Source: Ambulatory Visit | Attending: Gastroenterology | Admitting: Gastroenterology

## 2024-01-05 DIAGNOSIS — K8689 Other specified diseases of pancreas: Secondary | ICD-10-CM | POA: Insufficient documentation

## 2024-01-05 MED ORDER — GADOBUTROL 1 MMOL/ML IV SOLN
6.3000 mL | Freq: Once | INTRAVENOUS | Status: AC | PRN
  Administered 2024-01-05: 6.3 mL via INTRAVENOUS

## 2024-01-28 ENCOUNTER — Other Ambulatory Visit: Payer: Self-pay

## 2024-01-28 ENCOUNTER — Ambulatory Visit (HOSPITAL_COMMUNITY): Admitting: Anesthesiology

## 2024-01-28 ENCOUNTER — Ambulatory Visit (HOSPITAL_COMMUNITY)
Admission: RE | Admit: 2024-01-28 | Discharge: 2024-01-28 | Disposition: A | Discharge status: Home / Self Care | Payer: Medicare Other | Attending: Internal Medicine | Admitting: Internal Medicine

## 2024-01-28 ENCOUNTER — Encounter (HOSPITAL_COMMUNITY)
Admission: RE | Disposition: A | Discharge status: Home / Self Care | Payer: Self-pay | Source: Home / Self Care | Attending: Internal Medicine

## 2024-01-28 ENCOUNTER — Encounter (HOSPITAL_COMMUNITY): Payer: Self-pay | Admitting: Internal Medicine

## 2024-01-28 DIAGNOSIS — E890 Postprocedural hypothyroidism: Secondary | ICD-10-CM | POA: Diagnosis not present

## 2024-01-28 DIAGNOSIS — Z8619 Personal history of other infectious and parasitic diseases: Secondary | ICD-10-CM | POA: Insufficient documentation

## 2024-01-28 DIAGNOSIS — R933 Abnormal findings on diagnostic imaging of other parts of digestive tract: Secondary | ICD-10-CM | POA: Insufficient documentation

## 2024-01-28 DIAGNOSIS — Z79899 Other long term (current) drug therapy: Secondary | ICD-10-CM | POA: Insufficient documentation

## 2024-01-28 DIAGNOSIS — R1013 Epigastric pain: Secondary | ICD-10-CM | POA: Diagnosis present

## 2024-01-28 DIAGNOSIS — K219 Gastro-esophageal reflux disease without esophagitis: Secondary | ICD-10-CM | POA: Insufficient documentation

## 2024-01-28 DIAGNOSIS — K2289 Other specified disease of esophagus: Secondary | ICD-10-CM | POA: Insufficient documentation

## 2024-01-28 DIAGNOSIS — K8689 Other specified diseases of pancreas: Secondary | ICD-10-CM | POA: Diagnosis not present

## 2024-01-28 DIAGNOSIS — F17210 Nicotine dependence, cigarettes, uncomplicated: Secondary | ICD-10-CM | POA: Insufficient documentation

## 2024-01-28 DIAGNOSIS — K2970 Gastritis, unspecified, without bleeding: Secondary | ICD-10-CM | POA: Diagnosis not present

## 2024-01-28 DIAGNOSIS — G8929 Other chronic pain: Secondary | ICD-10-CM | POA: Diagnosis not present

## 2024-01-28 DIAGNOSIS — K5900 Constipation, unspecified: Secondary | ICD-10-CM | POA: Insufficient documentation

## 2024-01-28 DIAGNOSIS — K209 Esophagitis, unspecified without bleeding: Secondary | ICD-10-CM

## 2024-01-28 DIAGNOSIS — Z8711 Personal history of peptic ulcer disease: Secondary | ICD-10-CM | POA: Insufficient documentation

## 2024-01-28 HISTORY — PX: ESOPHAGOGASTRODUODENOSCOPY (EGD) WITH PROPOFOL: SHX5813

## 2024-01-28 SURGERY — ESOPHAGOGASTRODUODENOSCOPY (EGD) WITH PROPOFOL
Anesthesia: General

## 2024-01-28 MED ORDER — PROPOFOL 10 MG/ML IV BOLUS
INTRAVENOUS | Status: DC | PRN
  Administered 2024-01-28: 80 mg via INTRAVENOUS
  Administered 2024-01-28: 40 mg via INTRAVENOUS
  Administered 2024-01-28: 80 mg via INTRAVENOUS

## 2024-01-28 MED ORDER — LIDOCAINE HCL (PF) 2 % IJ SOLN
INTRAMUSCULAR | Status: DC | PRN
  Administered 2024-01-28: 100 mg via INTRADERMAL

## 2024-01-28 MED ORDER — STERILE WATER FOR IRRIGATION IR SOLN
Status: DC | PRN
  Administered 2024-01-28: 50 mL

## 2024-01-28 MED ORDER — LACTATED RINGERS IV SOLN: INTRAVENOUS | Status: DC | PRN

## 2024-01-28 MED ORDER — LACTATED RINGERS IV SOLN: INTRAVENOUS | Status: DC

## 2024-01-28 NOTE — Discharge Instructions (Addendum)
 EGD Discharge instructions Please read the instructions outlined below and refer to this sheet in the next few weeks. These discharge instructions provide you with general information on caring for yourself after you leave the hospital. Your doctor may also give you specific instructions. While your treatment has been planned according to the most current medical practices available, unavoidable complications occasionally occur. If you have any problems or questions after discharge, please call your doctor. ACTIVITY You may resume your regular activity but move at a slower pace for the next 24 hours.  Take frequent rest periods for the next 24 hours.  Walking will help expel (get rid of) the air and reduce the bloated feeling in your abdomen.  No driving for 24 hours (because of the anesthesia (medicine) used during the test).  You may shower.  Do not sign any important legal documents or operate any machinery for 24 hours (because of the anesthesia used during the test).  NUTRITION Drink plenty of fluids.  You may resume your normal diet.  Begin with a light meal and progress to your normal diet.  Avoid alcoholic beverages for 24 hours or as instructed by your caregiver.  MEDICATIONS You may resume your normal medications unless your caregiver tells you otherwise.  WHAT YOU CAN EXPECT TODAY You may experience abdominal discomfort such as a feeling of fullness or "gas" pains.  FOLLOW-UP Your doctor will discuss the results of your test with you.  SEEK IMMEDIATE MEDICAL ATTENTION IF ANY OF THE FOLLOWING OCCUR: Excessive nausea (feeling sick to your stomach) and/or vomiting.  Severe abdominal pain and distention (swelling).  Trouble swallowing.  Temperature over 101 F (37.8 C).  Rectal bleeding or vomiting of blood.    Your EGD revealed mild amount inflammation in your stomach.  I took biopsies of this to rule out infection with a bacteria called H. pylori.  I also took samples of your  esophagus. Await pathology results, my office will contact you.  Small bowel appeared normal.   Continue on Voquezna.   Follow up in GI office in 6-8 weeks.     I hope you have a great rest of your week!  Hennie Duos. Marletta Lor, D.O. Gastroenterology and Hepatology [facility]

## 2024-01-28 NOTE — Transfer of Care (Signed)
 Immediate Anesthesia Transfer of Care Note  Patient: Ann Reeves  Procedure(s) Performed: ESOPHAGOGASTRODUODENOSCOPY (EGD) WITH PROPOFOL  Patient Location: Endoscopy Unit  Anesthesia Type:General  Level of Consciousness: awake, alert , and oriented  Airway & Oxygen Therapy: Patient Spontanous Breathing  Post-op Assessment: Report given to RN and Post -op Vital signs reviewed and stable  Post vital signs: Reviewed and stable  Last Vitals:  Vitals Value Taken Time  BP 102/58 01/28/24 1155  Temp 36.3 C 01/28/24 1155  Pulse 73 01/28/24 1155  Resp 15 01/28/24 1155  SpO2 100 % 01/28/24 1155    Last Pain:  Vitals:   01/28/24 1155  TempSrc: Axillary  PainSc: 0-No pain      Patients Stated Pain Goal: 5 (01/28/24 1119)  Complications: No notable events documented.

## 2024-01-28 NOTE — Interval H&P Note (Signed)
 History and Physical Interval Note:  01/28/2024 11:35 AM  Ann Reeves  has presented today for surgery, with the diagnosis of epigastric pain,abnormal CT.  The various methods of treatment have been discussed with the patient and family. After consideration of risks, benefits and other options for treatment, the patient has consented to  Procedure(s) with comments: ESOPHAGOGASTRODUODENOSCOPY (EGD) WITH PROPOFOL (N/A) - 12:30 pm, asa 2 as a surgical intervention.  The patient's history has been reviewed, patient examined, no change in status, stable for surgery.  I have reviewed the patient's chart and labs.  Questions were answered to the patient's satisfaction.     Lanelle Bal

## 2024-01-28 NOTE — Anesthesia Preprocedure Evaluation (Signed)
 Anesthesia Evaluation  Patient identified by MRN, date of birth, ID band Patient awake    Reviewed: Allergy & Precautions, H&P , NPO status , Patient's Chart, lab work & pertinent test results, reviewed documented beta blocker date and time   Airway Mallampati: II  TM Distance: >3 FB Neck ROM: full    Dental no notable dental hx.    Pulmonary neg pulmonary ROS, Current Smoker   Pulmonary exam normal breath sounds clear to auscultation       Cardiovascular Exercise Tolerance: Good hypertension, negative cardio ROS  Rhythm:regular Rate:Normal     Neuro/Psych  Neuromuscular disease negative neurological ROS  negative psych ROS   GI/Hepatic negative GI ROS, Neg liver ROS, PUD,GERD  ,,  Endo/Other  negative endocrine ROSHypothyroidism    Renal/GU negative Renal ROS  negative genitourinary   Musculoskeletal   Abdominal   Peds  Hematology negative hematology ROS (+)   Anesthesia Other Findings   Reproductive/Obstetrics negative OB ROS                             Anesthesia Physical Anesthesia Plan  ASA: 2  Anesthesia Plan: General   Post-op Pain Management:    Induction:   PONV Risk Score and Plan: Propofol infusion  Airway Management Planned:   Additional Equipment:   Intra-op Plan:   Post-operative Plan:   Informed Consent: I have reviewed the patients History and Physical, chart, labs and discussed the procedure including the risks, benefits and alternatives for the proposed anesthesia with the patient or authorized representative who has indicated his/her understanding and acceptance.     Dental Advisory Given  Plan Discussed with: CRNA  Anesthesia Plan Comments:        Anesthesia Quick Evaluation

## 2024-01-28 NOTE — Op Note (Signed)
 [facility] Patient Name: Ann Reeves Procedure Date: 01/28/2024 11:31 AM MRN: [id number] Date of Birth: 02/23/1956 Attending MD: Hennie Duos. Marletta Lor , [place], [id number] CSN: [id number] Age: 68 Admit Type: Outpatient Procedure:                Upper GI endoscopy Indications:              Epigastric abdominal pain, Abnormal CT of the GI                            tract Providers:                Hennie Duos. Marletta Lor, DO, Buel Ream. Thomasena Edis RN, RN,                            Dyann Ruddle Referring MD:              Medicines:                See the Anesthesia note for documentation of the                            administered medications Complications:            No immediate complications. Estimated Blood Loss:     Estimated blood loss was minimal. Procedure:                Pre-Anesthesia Assessment:                           - The anesthesia plan was to use monitored                            anesthesia care (MAC).                           After obtaining informed consent, the endoscope was                            passed under direct vision. Throughout the                            procedure, the patient's blood pressure, pulse, and                            oxygen saturations were monitored continuously. The                            [id number]-H190 ([id number]) scope was introduced through the                            mouth, and advanced to the second part of duodenum.                            The upper GI endoscopy was accomplished without                            difficulty. The patient  tolerated the procedure                            well. Scope In: 11:44:00 AM Scope Out: 11:49:54 AM Total Procedure Duration: 0 hours 5 minutes 54 seconds  Findings:      Mucosal changes including ringed esophagus were found in the middle       third of the esophagus. Biopsies were taken with a cold forceps for       histology.      Patchy mild inflammation characterized by erythema was  found in the       gastric body and in the gastric antrum. Biopsies were taken with a cold       forceps for Helicobacter pylori testing.      The duodenal bulb, first portion of the duodenum and second portion of       the duodenum were normal. Impression:               - Esophageal mucosal changes. Biopsied.                           - Gastritis. Biopsied.                           - Normal duodenal bulb, first portion of the                            duodenum and second portion of the duodenum. Moderate Sedation:      Per Anesthesia Care Recommendation:           - Patient has a contact number available for                            emergencies. The signs and symptoms of potential                            delayed complications were discussed with the                            patient. Return to normal activities tomorrow.                            Written discharge instructions were provided to the                            patient.                           - Resume previous diet.                           - Continue present medications.                           - Await pathology results.                           - Return to GI clinic in 8 weeks.                           -  Continue on Voquezna Procedure Code(s):        --- Professional ---                           [phone], Esophagogastroduodenoscopy, flexible,                            transoral; with biopsy, single or multiple Diagnosis Code(s):        --- Professional ---                           K22.89, Other specified disease of esophagus                           K29.70, Gastritis, unspecified, without bleeding                           R10.13, Epigastric pain                           R93.3, Abnormal findings on diagnostic imaging of                            other parts of digestive tract CPT copyright 2022 American Medical Association. All rights reserved. The codes documented in this report are preliminary and  upon coder review may  be revised to meet current compliance requirements. Hennie Duos. Marletta Lor, DO Hennie Duos. Marletta Lor, DO 01/28/2024 11:53:20 AM This report has been signed electronically. Number of Addenda: 0

## 2024-01-29 ENCOUNTER — Encounter (HOSPITAL_COMMUNITY): Payer: Self-pay | Admitting: Internal Medicine

## 2024-01-29 LAB — SURGICAL PATHOLOGY

## 2024-01-30 ENCOUNTER — Encounter: Payer: Self-pay | Admitting: Internal Medicine

## 2024-01-30 ENCOUNTER — Ambulatory Visit (INDEPENDENT_AMBULATORY_CARE_PROVIDER_SITE_OTHER): Payer: Medicare Other | Admitting: Internal Medicine

## 2024-01-30 VITALS — BP 96/64 | HR 80 | Ht 66.0 in | Wt 134.2 lb

## 2024-01-30 DIAGNOSIS — E039 Hypothyroidism, unspecified: Secondary | ICD-10-CM | POA: Diagnosis not present

## 2024-01-30 DIAGNOSIS — E782 Mixed hyperlipidemia: Secondary | ICD-10-CM

## 2024-01-30 DIAGNOSIS — K279 Peptic ulcer, site unspecified, unspecified as acute or chronic, without hemorrhage or perforation: Secondary | ICD-10-CM

## 2024-01-30 DIAGNOSIS — I10 Essential (primary) hypertension: Secondary | ICD-10-CM

## 2024-01-30 DIAGNOSIS — M25511 Pain in right shoulder: Secondary | ICD-10-CM

## 2024-01-30 DIAGNOSIS — E559 Vitamin D deficiency, unspecified: Secondary | ICD-10-CM

## 2024-01-30 DIAGNOSIS — G8929 Other chronic pain: Secondary | ICD-10-CM

## 2024-01-30 MED ORDER — SUCRALFATE 1 G PO TABS: 1.0000 g | ORAL_TABLET | Freq: Three times a day (TID) | ORAL | 0 refills | Status: DC

## 2024-01-30 NOTE — Patient Instructions (Addendum)
 Please take Tylenol arthritis as needed for shoulder pain.  Please get x-ray of right shoulder at [facility].  Please continue to take medications as prescribed.  Please get fasting blood tests done within a week.

## 2024-01-30 NOTE — Assessment & Plan Note (Addendum)
 Lab Results  Component Value Date   TSH 0.273 (L) 10/02/2023    Oversupplemented On levothyroxine 150 mcg QD, but had been taking 200 mcg dose due to pharmacy refill error till 01/25 Check TSH, free T4, adjust dose of levothyroxine based on blood tests

## 2024-01-30 NOTE — Progress Notes (Signed)
 Established Patient Office Visit  Subjective:  Patient ID: Ann Reeves, female    DOB: 07/31/1956  Age: 68 y.o. MRN: [id number]  CC:  Chief Complaint  Patient presents with   Hypothyroidism    Four month follow up     HPI Ann Reeves is a 68 y.o. female with past medical history of GERD, hypothyroidism and sciatica who presents for f/u of her chronic medical conditions.  GERD: She has been feeling better with Voquezna now.  She takes sucralfate as needed for persistent acid reflux.  She has been treated for H. pylori in the past.  She denies any dysphagia or odynophagia currently.  Hypothyroidism: Her TSH was low and her free T4 was wnl in 11/24.  She has been taking levothyroxine 150 mcg daily since 01/25. She had been getting 200 mcg dose till then despite decreasing dose to 150 mcg in 07/24 due to pharmacy dispensing or refill error.  Denies any tremors or palpitations currently.  Her weight has been stable since the last visit.  HTN: Her BP was  wnl today. She denies any headache, dizziness, chest pain, dyspnea or palpitations currently.  She reports right shoulder pain since 09/18/23.  Her pain is intermittent, dull, in anterior area, worse with above shoulder extension and better with Tylenol.  Denies any recent injury or overhead lifting.  Denies any numbness or tingling of UE currently.  Past Medical History:  Diagnosis Date   GERD (gastroesophageal reflux disease)    Helicobacter pylori gastritis 02/2021   Treated with Prevpac.  Documented eradication in December 2022.   Hypertension    Hyperthyroidism- had goiter removed in 1998    Mixed hyperlipidemia 08/27/2022    Past Surgical History:  Procedure Laterality Date   BIOPSY  02/27/2021   Procedure: BIOPSY;  Surgeon: Lanelle Bal, DO;  [facility];  Service: Endoscopy;;  gastric   COLONOSCOPY WITH PROPOFOL N/A 01/21/2023   Procedure: COLONOSCOPY WITH PROPOFOL;  Surgeon: Lanelle Bal, DO;   [facility];  Service: Endoscopy;  Laterality: N/A;  900am, asa 2   ESOPHAGOGASTRODUODENOSCOPY (EGD) WITH PROPOFOL N/A 02/27/2021   Surgeon: Lanelle Bal, DO; H. pylori gastritis.  Treated with Prevpac.   ESOPHAGOGASTRODUODENOSCOPY (EGD) WITH PROPOFOL N/A 01/28/2024   Procedure: ESOPHAGOGASTRODUODENOSCOPY (EGD) WITH PROPOFOL;  Surgeon: Lanelle Bal, DO;  [facility];  Service: Endoscopy;  Laterality: N/A;  12:30 pm, asa 2   POLYPECTOMY  01/21/2023   Procedure: POLYPECTOMY;  Surgeon: Lanelle Bal, DO;  [facility];  Service: Endoscopy;;   THYROID SURGERY      Family History  Problem Relation Age of Onset   Kidney disease Sister    Cancer Maternal Aunt    Pancreatic cancer Neg Hx    Pancreatitis Neg Hx     Social History   Socioeconomic History   Marital status: Married    Spouse name: Not on file   Number of children: 1   Years of education: Not on file   Highest education level: 12th grade  Occupational History   Occupation: RetiredNurse, 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 [place]  Tobacco Use   Smoking status: Some Days    Current packs/day: 0.50    Average packs/day: 0.5 packs/day for 10.0 years (5.0 ttl pk-yrs)    Types: Cigarettes    Passive exposure: Current   Smokeless tobacco: Never  Vaping Use   Vaping status: Never Used  Substance and Sexual Activity  Alcohol use: Yes    Alcohol/week: 2.0 standard drinks of alcohol    Types: 2 Glasses of wine per week    Comment: occ   Drug use: Never   Sexual activity: Yes    Birth control/protection: Post-menopausal  Other Topics Concern   Not on file  Social History Narrative   Son lives in [place]; family lives in [place], moved to [place] at age 27   Social Drivers of Health   Financial Resource Strain: Low Risk  (09/11/2023)   Overall Financial Resource Strain (CARDIA)    Difficulty of Paying Living Expenses: Not hard at all  Food Insecurity: No Food Insecurity (09/11/2023)    Hunger Vital Sign    Worried About Running Out of Food in the Last Year: Never true    Ran Out of Food in the Last Year: Never true  Transportation Needs: No Transportation Needs (09/11/2023)   PRAPARE - Administrator, Civil Service (Medical): No    Lack of Transportation (Non-Medical): No  Physical Activity: Insufficiently Active (09/11/2023)   Exercise Vital Sign    Days of Exercise per Week: 2 days    Minutes of Exercise per Session: 20 min  Stress: No Stress Concern Present (09/11/2023)   [employer] of Occupational Health - Occupational Stress Questionnaire    Feeling of Stress : Only a little  Social Connections: Moderately Integrated (09/11/2023)   Social Connection and Isolation Panel [NHANES]    Frequency of Communication with Friends and Family: More than three times a week    Frequency of Social Gatherings with Friends and Family: More than three times a week    Attends Religious Services: More than 4 times per year    Active Member of [employer] or Organizations: No    Attends Banker Meetings: Never    Marital Status: Married  Catering manager Violence: Not At Risk (09/11/2023)   Humiliation, Afraid, Rape, and Kick questionnaire    Fear of Current or Ex-Partner: No    Emotionally Abused: No    Physically Abused: No    Sexually Abused: No    Outpatient Medications Prior to Visit  Medication Sig Dispense Refill   acetaminophen (TYLENOL) 500 MG tablet Take 1,000 mg by mouth every 6 (six) hours as needed for moderate pain.     albuterol (VENTOLIN HFA) 108 (90 Base) MCG/ACT inhaler INHALE 2 PUFFS BY MOUTH EVERY 6 HOURS AS NEEDED FOR WHEEZING OR SHORTNESS OF BREATH 18 g 0   calcium carbonate (TUMS - DOSED IN MG ELEMENTAL CALCIUM) 500 MG chewable tablet Chew 2 tablets by mouth daily as needed for indigestion or heartburn.     cholecalciferol (VITAMIN D) 25 MCG (1000 UNIT) tablet Take 2,000 Units by mouth daily.     levothyroxine (SYNTHROID) 150 MCG  tablet TAKE 1 TABLET BY MOUTH ONCE DAILY BEFORE BREAKFAST - DOSE CHANGE 90 tablet 0   ondansetron (ZOFRAN) 4 MG tablet Take 1 tablet (4 mg total) by mouth every 6 (six) hours. As needed for nausea/vomiting 12 tablet 0   Vonoprazan Fumarate (VOQUEZNA) 20 MG TABS Take 1 tablet by mouth daily. 30 tablet 5   sucralfate (CARAFATE) 1 g tablet Take 1 tablet (1 g total) by mouth 4 (four) times daily -  with meals and at bedtime. 120 tablet 0   No facility-administered medications prior to visit.    No Known Allergies  ROS Review of Systems  Constitutional:  Negative for chills and fever.  HENT:  Negative for  congestion, sinus pressure, sinus pain and sore throat.   Eyes:  Negative for pain and discharge.  Respiratory:  Negative for cough and shortness of breath.   Cardiovascular:  Negative for chest pain and palpitations.  Gastrointestinal:  Negative for constipation, diarrhea, nausea and vomiting.  Endocrine: Negative for polydipsia and polyuria.  Genitourinary:  Negative for dysuria and hematuria.  Musculoskeletal:  Positive for arthralgias (Right shoulder). Negative for neck pain and neck stiffness.  Skin:  Negative for rash.  Neurological:  Negative for dizziness and weakness.  Psychiatric/Behavioral:  Negative for agitation and behavioral problems.       Objective:    Physical Exam Vitals reviewed.  Constitutional:      General: She is not in acute distress.    Appearance: She is not diaphoretic.  HENT:     Head: Normocephalic and atraumatic.     Nose: Nose normal. No congestion.     Mouth/Throat:     Mouth: Mucous membranes are moist.     Pharynx: No posterior oropharyngeal erythema.  Eyes:     General: No scleral icterus.    Extraocular Movements: Extraocular movements intact.  Neck:     Thyroid: No thyromegaly.  Cardiovascular:     Rate and Rhythm: Normal rate and regular rhythm.     Heart sounds: Normal heart sounds. No murmur heard. Pulmonary:     Breath sounds:  Normal breath sounds. No wheezing or rales.  Musculoskeletal:     Right shoulder: Tenderness (Anterior part) present.     Cervical back: Neck supple. No tenderness.     Right lower leg: No edema.     Left lower leg: No edema.     Comments: ROM at right shoulder painful  Skin:    General: Skin is warm.     Findings: No rash.  Neurological:     General: No focal deficit present.     Mental Status: She is alert and oriented to person, place, and time.  Psychiatric:        Mood and Affect: Mood normal.        Behavior: Behavior normal.     BP 96/64 (BP Location: Right Arm, Patient Position: Sitting, Cuff Size: Normal)   Pulse 80   Ht 5\' 6"  (1.676 m)   Wt 134 lb 3.2 oz (60.9 kg)   SpO2 97%   BMI 21.66 kg/m  Wt Readings from Last 3 Encounters:  01/30/24 134 lb 3.2 oz (60.9 kg)  01/28/24 134 lb (60.8 kg)  01/02/24 139 lb (63 kg)    Lab Results  Component Value Date   TSH 0.273 (L) 10/02/2023   Lab Results  Component Value Date   WBC 9.3 12/10/2023   HGB 12.3 12/10/2023   HCT 37.6 12/10/2023   MCV 92.2 12/10/2023   PLT 253 12/10/2023   Lab Results  Component Value Date   NA 136 12/10/2023   K 3.2 (L) 12/10/2023   CO2 25 12/10/2023   GLUCOSE 105 (H) 12/10/2023   BUN 22 12/10/2023   CREATININE 1.04 (H) 12/10/2023   BILITOT 0.5 12/10/2023   ALKPHOS 63 12/10/2023   AST 19 12/10/2023   ALT 13 12/10/2023   PROT 6.8 12/10/2023   ALBUMIN 3.6 12/10/2023   CALCIUM 7.8 (L) 12/10/2023   ANIONGAP 7 12/10/2023   EGFR 73 06/05/2023   Lab Results  Component Value Date   CHOL 197 01/09/2023   Lab Results  Component Value Date   HDL 57 01/09/2023   Lab Results  Component Value Date   LDLCALC 123 (H) 01/09/2023   Lab Results  Component Value Date   TRIG 94 01/09/2023   Lab Results  Component Value Date   CHOLHDL 3.5 01/09/2023   No results found for: "HGBA1C"    Assessment & Plan:   Problem List Items Addressed This Visit       Cardiovascular and  Mediastinum   Essential hypertension   BP Readings from Last 1 Encounters:  01/30/24 96/64   New onset in the previous visit - well-controlled now Could be due to oversupplemented thyroid as well Advised DASH diet and moderate exercise/walking, at least 150 mins/week        Digestive   PUD (peptic ulcer disease)   Well controlled now Has tried omeprazole, Dexilant and rabeprazole Needs to avoid NSAIDs, advised to take Tylenol or Voltaren gel for arthritis Followed by Dr. Marletta Lor - on Voquezna, needs to take it regularly, advised to follow up with GI Recent ER visit for resistant acid reflux/epigastric pain - takes Sucralfate 1 gm tablets BID and then PRN      Relevant Medications   sucralfate (CARAFATE) 1 g tablet     Endocrine   Acquired hypothyroidism - Primary   Lab Results  Component Value Date   TSH 0.273 (L) 10/02/2023    Oversupplemented On levothyroxine 150 mcg QD, but had been taking 200 mcg dose due to pharmacy refill error till 01/25 Check TSH, free T4, adjust dose of levothyroxine based on blood tests      Relevant Orders   TSH + free T4   CMP14+EGFR     Other   Mixed hyperlipidemia   Had not started treatment as her thyroid levels were abnormal in the past Recheck lipid profile - LDL was improving in 2024      Relevant Orders   Lipid Profile   Chronic right shoulder pain   Has tenderness in the anterior part of shoulder Could be tendinitis or bursitis Advised to take Tylenol as needed for pain Unable to take oral NSAIDs due to GERD Apply ice or cool compresses as needed Check x-ray of right shoulder If persistent, will refer to orthopedic surgery      Relevant Orders   DG Shoulder Right   Other Visit Diagnoses       Vitamin D deficiency       Relevant Orders   Vitamin D (25 hydroxy)       Meds ordered this encounter  Medications   sucralfate (CARAFATE) 1 g tablet    Sig: Take 1 tablet (1 g total) by mouth 4 (four) times daily -  with  meals and at bedtime.    Dispense:  120 tablet    Refill:  0    Follow-up: Return in about 4 months (around 05/31/2024).    Anabel Halon, MD

## 2024-01-30 NOTE — Assessment & Plan Note (Addendum)
 BP Readings from Last 1 Encounters:  01/30/24 96/64   New onset in the previous visit - well-controlled now Could be due to oversupplemented thyroid as well Advised DASH diet and moderate exercise/walking, at least 150 mins/week

## 2024-01-30 NOTE — Assessment & Plan Note (Signed)
 Has tenderness in the anterior part of shoulder Could be tendinitis or bursitis Advised to take Tylenol as needed for pain Unable to take oral NSAIDs due to GERD Apply ice or cool compresses as needed Check x-ray of right shoulder If persistent, will refer to orthopedic surgery

## 2024-01-30 NOTE — Assessment & Plan Note (Addendum)
 Well controlled now Has tried omeprazole, Dexilant and rabeprazole Needs to avoid NSAIDs, advised to take Tylenol or Voltaren gel for arthritis Followed by Dr. Marletta Lor - on Voquezna, needs to take it regularly, advised to follow up with GI Recent ER visit for resistant acid reflux/epigastric pain - takes Sucralfate 1 gm tablets BID and then PRN

## 2024-01-30 NOTE — Assessment & Plan Note (Signed)
 Had not started treatment as her thyroid levels were abnormal in the past Recheck lipid profile - LDL was improving in 2024

## 2024-02-01 NOTE — Anesthesia Postprocedure Evaluation (Signed)
 Anesthesia Post Note  Patient: Ann Reeves  Procedure(s) Performed: ESOPHAGOGASTRODUODENOSCOPY (EGD) WITH PROPOFOL  Patient location during evaluation: Phase II Anesthesia Type: General Level of consciousness: awake Pain management: pain level controlled Vital Signs Assessment: post-procedure vital signs reviewed and stable Respiratory status: spontaneous breathing and respiratory function stable Cardiovascular status: blood pressure returned to baseline and stable Postop Assessment: no headache and no apparent nausea or vomiting Anesthetic complications: no Comments: Late entry   No notable events documented.   Last Vitals:  Vitals:   01/28/24 1119 01/28/24 1155  BP: (!) 163/78 (!) 102/58  Pulse: 71 73  Resp: 16 15  Temp: 36.7 C (!) 36.3 C  SpO2: 100% 100%    Last Pain:  Vitals:   01/28/24 1155  TempSrc: Axillary  PainSc: 0-No pain                 Windell Norfolk

## 2024-03-16 ENCOUNTER — Other Ambulatory Visit: Payer: Self-pay | Admitting: Internal Medicine

## 2024-03-16 DIAGNOSIS — E039 Hypothyroidism, unspecified: Secondary | ICD-10-CM

## 2024-03-16 NOTE — Progress Notes (Deleted)
 Referring Provider: Meldon Sport, MD Primary Care Physician:  Meldon Sport, MD Primary GI Physician: Dr. Mordechai April  No chief complaint on file.   HPI:   Ann Reeves is a 68 y.o. female with history of GERD, H. pylori gastritis April 2022 treated with Prevpac with documented eradication, chronic epigastric pain, constipation,  small IPMN, presenting today for follow-up of GERD.   Last seen in the office 01/02/2024.  Reported GERD improved with Voquezna , but still with breakthrough symptoms most evenings.  Patient did report newer onset epigastric pain in October 2024 that seemed to be closely associated with GERD.  Denied NSAIDs. Noted recent CT 12/10/2023 showing fluid dilation of the gastric antrum and duodenal bulb with question of redundant duodenum versus diverticular versus ulceration of the first portion of the duodenum.  Also noted indeterminate proximal and mid pancreatic duct dilation on CT as well.  Recommended MRI/MRCP, EGD, continue Voquezna , Carafate  4 times daily, may add Pepcid  at bedtime if heartburn continued in the evening, follow-up after EGD.   MRI/MRCP 01/05/2024: IMPRESSION: 1. Minimally prominent pancreatic duct on today's examination measuring up to 0.4 cm in caliber. Duct tapers smoothly to the ampulla without calculus or other obstruction appreciated. This is of doubtful clinical significance. 2. Fluid signal cystic lesion in the pancreatic neck measuring 0.7 cm, consistent with a small side branch IPMN. Given tiny size, this is benign and no further follow-up or characterization is required. 3. No acute findings of the abdomen.  She was placed on recall for MRI/MRCP in 2 years due to small IPMN.    EGD 01/28/24:  Esophageal mucosal changes biopsied, gastritis biopsied, normal examined duodenum.  Pathology with reflux esophagitis.  Gastric biopsy benign.   Today:    GERD:  Previously failed Dexilant , omeprazole, pantoprazole , Nexium OTC, rabeprazole  and  feels she has had the most improvement with Voquezna . ***   ?surgical management of GERD ***    Past Medical History:  Diagnosis Date   GERD (gastroesophageal reflux disease)    Helicobacter pylori gastritis 02/2021   Treated with Prevpac.  Documented eradication in December 2022.   Hypertension    Hyperthyroidism- had goiter removed in 1998    Mixed hyperlipidemia 08/27/2022    Past Surgical History:  Procedure Laterality Date   BIOPSY  02/27/2021   Procedure: BIOPSY;  Surgeon: Vinetta Greening, DO;  [facility];  Service: Endoscopy;;  gastric   COLONOSCOPY WITH PROPOFOL  N/A 01/21/2023   Procedure: COLONOSCOPY WITH PROPOFOL ;  Surgeon: Vinetta Greening, DO;  [facility];  Service: Endoscopy;  Laterality: N/A;  900am, asa 2   ESOPHAGOGASTRODUODENOSCOPY (EGD) WITH PROPOFOL  N/A 02/27/2021   Surgeon: Vinetta Greening, DO; H. pylori gastritis.  Treated with Prevpac.   ESOPHAGOGASTRODUODENOSCOPY (EGD) WITH PROPOFOL  N/A 01/28/2024   Procedure: ESOPHAGOGASTRODUODENOSCOPY (EGD) WITH PROPOFOL ;  Surgeon: Vinetta Greening, DO;  [facility];  Service: Endoscopy;  Laterality: N/A;  12:30 pm, asa 2   POLYPECTOMY  01/21/2023   Procedure: POLYPECTOMY;  Surgeon: Vinetta Greening, DO;  [facility];  Service: Endoscopy;;   THYROID  SURGERY      Current Outpatient Medications  Medication Sig Dispense Refill   acetaminophen (TYLENOL) 500 MG tablet Take 1,000 mg by mouth every 6 (six) hours as needed for moderate pain.     albuterol  (VENTOLIN  HFA) 108 (90 Base) MCG/ACT inhaler INHALE 2 PUFFS BY MOUTH EVERY 6 HOURS AS NEEDED FOR WHEEZING OR SHORTNESS OF BREATH 18 g  0   calcium  carbonate (TUMS - DOSED IN MG ELEMENTAL CALCIUM ) 500 MG chewable tablet Chew 2 tablets by mouth daily as needed for indigestion or heartburn.     cholecalciferol (VITAMIN D) 25 MCG (1000 UNIT) tablet Take 2,000 Units by mouth daily.     levothyroxine  (SYNTHROID ) 150 MCG tablet TAKE  1 TABLET BY MOUTH ONCE DAILY BEFORE BREAKFAST - DOSE CHANGE 90 tablet 0   ondansetron  (ZOFRAN ) 4 MG tablet Take 1 tablet (4 mg total) by mouth every 6 (six) hours. As needed for nausea/vomiting 12 tablet 0   sucralfate  (CARAFATE ) 1 g tablet Take 1 tablet (1 g total) by mouth 4 (four) times daily -  with meals and at bedtime. 120 tablet 0   Vonoprazan Fumarate  (VOQUEZNA ) 20 MG TABS Take 1 tablet by mouth daily. 30 tablet 5   No current facility-administered medications for this visit.    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[place]  Tobacco Use   Smoking status: Some Days    Current packs/day: 0.50    Average packs/day: 0.5 packs/day for 10.0 years (5.0 ttl pk-yrs)    Types: Cigarettes    Passive exposure: Current   Smokeless tobacco: Never  Vaping Use   Vaping status: Never Used  Substance and Sexual Activity   Alcohol use: Yes    Alcohol/week: 2.0 standard drinks of alcohol    Types: 2 Glasses of wine per week    Comment: occ   Drug use: Never   Sexual activity: Yes    Birth control/protection: Post-menopausal  Other Topics Concern   Not on file  Social History Narrative   Son lives in [place]; family lives in [place], moved to [place] at age 37   Social Drivers of Health   Financial Resource Strain: Low Risk  (09/11/2023)   Overall Financial Resource Strain (CARDIA)    Difficulty of Paying Living Expenses: Not hard at all  Food Insecurity: No Food Insecurity (09/11/2023)   Hunger Vital Sign    Worried About Running Out of Food in the Last  Year: Never true    Ran Out of Food in the Last Year: Never true  Transportation Needs: No Transportation Needs (09/11/2023)   PRAPARE - Administrator, Civil Service (Medical): No    Lack of Transportation (Non-Medical): No  Physical Activity: Insufficiently Active (09/11/2023)   Exercise Vital Sign    Days of Exercise per Week: 2 days    Minutes of Exercise per Session: 20 min  Stress: No Stress Concern Present (09/11/2023)   [employer] of Occupational Health - Occupational Stress Questionnaire    Feeling of Stress : Only a little  Social Connections: Moderately Integrated (09/11/2023)   Social Connection and Isolation Panel [NHANES]    Frequency of Communication with Friends and Family: More than three times a week    Frequency of Social Gatherings with Friends and Family: More than three times a week  Attends Religious Services: More than 4 times per year    Active Member of Clubs or Organizations: No    Attends Banker Meetings: Never    Marital Status: Married    Review of Systems: Gen: Denies fever, chills, anorexia. Denies fatigue, weakness, weight loss.  CV: Denies chest pain, palpitations, syncope, peripheral edema, and claudication. Resp: Denies dyspnea at rest, cough, wheezing, coughing up blood, and pleurisy. GI: Denies vomiting blood, jaundice, and fecal incontinence.   Denies dysphagia or odynophagia. Derm: Denies rash, itching, dry skin Psych: Denies depression, anxiety, memory loss, confusion. No homicidal or suicidal ideation.  Heme: Denies bruising, bleeding, and enlarged lymph nodes.  Physical Exam: There were no vitals taken for this visit. General:   Alert and oriented. No distress noted. Pleasant and cooperative.  Head:  Normocephalic and atraumatic. Eyes:  Conjuctiva clear without scleral icterus. Heart:  S1, S2 present without murmurs appreciated. Lungs:  Clear to auscultation bilaterally. No wheezes, rales, or rhonchi.  No distress.  Abdomen:  +BS, soft, non-tender and non-distended. No rebound or guarding. No HSM or masses noted. Msk:  Symmetrical without gross deformities. Normal posture. Extremities:  Without edema. Neurologic:  Alert and  oriented x4 Psych:  Normal mood and affect.    Assessment:     Plan:  ***   Shana Daring, PA-C [facility] 03/18/2024

## 2024-03-18 ENCOUNTER — Ambulatory Visit: Admitting: Gastroenterology

## 2024-06-03 ENCOUNTER — Ambulatory Visit: Admitting: Internal Medicine

## 2024-06-22 ENCOUNTER — Other Ambulatory Visit: Payer: Self-pay | Admitting: Gastroenterology

## 2024-06-22 DIAGNOSIS — K219 Gastro-esophageal reflux disease without esophagitis: Secondary | ICD-10-CM

## 2024-07-09 ENCOUNTER — Other Ambulatory Visit: Payer: Self-pay | Admitting: Internal Medicine

## 2024-07-09 MED ORDER — LEVOTHYROXINE SODIUM 150 MCG PO TABS: 150.0000 ug | ORAL_TABLET | Freq: Every day | ORAL | 0 refills | Status: DC

## 2024-07-09 NOTE — Telephone Encounter (Unsigned)
 Copied from CRM [id number]. Topic: Clinical - Medication Refill >> Jul 09, 2024  9:06 AM Berwyn MATSU wrote: Medication: levothyroxine  (SYNTHROID ) 150 MCG tablet   Has the patient contacted their pharmacy? Yes (Agent: If no, request that the patient contact the pharmacy for the refill. If patient does not wish to contact the pharmacy document the reason why and proceed with request.) (Agent: If yes, when and what did the pharmacy advise?)  This is the patient's preferred pharmacy:  [facility] [address], [place] - [address] [place] [address] Phone: [phone] Fax: [phone]  Is this the correct pharmacy for this prescription? Yes If no, delete pharmacy and type the correct one.   Has the prescription been filled recently? Yes  Is the patient out of the medication? Yes  Has the patient been seen for an appointment in the last year OR does the patient have an upcoming appointment? Yes  Can we respond through MyChart? Yes  Agent: Please be advised that Rx refills may take up to 3 business days. We ask that you follow-up with your pharmacy.

## 2024-07-15 ENCOUNTER — Encounter: Payer: Self-pay | Admitting: Internal Medicine

## 2024-07-15 ENCOUNTER — Ambulatory Visit (INDEPENDENT_AMBULATORY_CARE_PROVIDER_SITE_OTHER): Admitting: Internal Medicine

## 2024-07-15 VITALS — BP 136/70 | HR 74 | Ht 66.0 in | Wt 141.2 lb

## 2024-07-15 DIAGNOSIS — K047 Periapical abscess without sinus: Secondary | ICD-10-CM | POA: Diagnosis not present

## 2024-07-15 DIAGNOSIS — I10 Essential (primary) hypertension: Secondary | ICD-10-CM

## 2024-07-15 DIAGNOSIS — E782 Mixed hyperlipidemia: Secondary | ICD-10-CM

## 2024-07-15 DIAGNOSIS — E039 Hypothyroidism, unspecified: Secondary | ICD-10-CM

## 2024-07-15 MED ORDER — AMOXICILLIN-POT CLAVULANATE 875-125 MG PO TABS: 1.0000 | ORAL_TABLET | Freq: Two times a day (BID) | ORAL | 0 refills | Status: DC

## 2024-07-15 MED ORDER — OXYCODONE-ACETAMINOPHEN 5-325 MG PO TABS: 1.0000 | ORAL_TABLET | Freq: Four times a day (QID) | ORAL | 0 refills | Status: AC | PRN

## 2024-07-15 NOTE — Patient Instructions (Addendum)
 Please start taking Augmentin  as prescribed for dental abscess.  Please take Tylenol  for mild-moderate pain and take Percocet only for severe pain.  Please contact Dentist to get evaluated for abscess.  Please continue to take medications as prescribed.  Please continue to follow low salt diet and perform moderate exercise/walking as tolerated.

## 2024-07-15 NOTE — Assessment & Plan Note (Signed)
 Started empiric Augmentin  Tylenol  as needed for mild to moderate pain, Percocet as needed for severe pain Advised to contact dentist offices in [place]

## 2024-07-15 NOTE — Assessment & Plan Note (Signed)
 Lab Results  Component Value Date   TSH 0.273 (L) 10/02/2023    Was oversupplemented On levothyroxine  150 mcg QD, but had been taking 200 mcg dose due to pharmacy refill error till 01/25 Check TSH, free T4, adjust dose of levothyroxine  based on blood tests

## 2024-07-15 NOTE — Assessment & Plan Note (Signed)
 Had not started treatment as her thyroid levels were abnormal in the past Recheck lipid profile - LDL was improving in 2024

## 2024-07-15 NOTE — Progress Notes (Signed)
 Established Patient Office Visit  Subjective:  Patient ID: Ann Reeves, female    DOB: [birth year]  Age: 68 y.o. MRN: [id number]  CC:  Chief Complaint  Patient presents with   Medical Management of Chronic Issues    4 month f/u    Dental Pain    Reports dental pain on left side ongoing since last night, has not been able to find a dentist to abstract it.     HPI Ann Reeves is a 68 y.o. female with past medical history of GERD, hypothyroidism and sciatica who presents for f/u of her chronic medical conditions.  She reports left upper tooth pain since last night.  Her pain is constant, sharp, radiating toward left forehead area.  She has been Personal assistant offices, but has not been able to schedule appointment yet  GERD: She has been feeling better with Voquezna  now.  She takes sucralfate  as needed for persistent acid reflux.  She has been treated for H. pylori in the past.  She denies any dysphagia or odynophagia currently.  Hypothyroidism: Her TSH was low and her free T4 was wnl in 11/24. She has not had blood tests yet. She has been taking levothyroxine  150 mcg daily since 01/25. She had been getting 200 mcg dose till then despite decreasing dose to 150 mcg in 07/24 due to pharmacy dispensing or refill error.  Denies any tremors or palpitations currently.  She has gained 7 lbs weight since the last visit.  HTN: Her BP was elevated today, she attributes it to dental pain. She reports that it has been better at home. She denies any headache, dizziness, chest pain, dyspnea or palpitations currently.  Past Medical History:  Diagnosis Date   GERD (gastroesophageal reflux disease)    Helicobacter pylori gastritis 02/2021   Treated with Prevpac.  Documented eradication in December 2022.   Hypertension    Hyperthyroidism- had goiter removed in 1998    Mixed hyperlipidemia 08/27/2022    Past Surgical History:  Procedure Laterality Date   BIOPSY  02/27/2021   Procedure:  BIOPSY;  Surgeon: Cindie Carlin POUR, DO;  [facility]: [facility];  Service: Endoscopy;;  gastric   COLONOSCOPY WITH PROPOFOL  N/A 01/21/2023   Procedure: COLONOSCOPY WITH PROPOFOL ;  Surgeon: Cindie Carlin POUR, DO;  Location: [facility];  Service: Endoscopy;  Laterality: N/A;  900am, asa 2   ESOPHAGOGASTRODUODENOSCOPY (EGD) WITH PROPOFOL  N/A 02/27/2021   Surgeon: Cindie Carlin POUR, DO; H. pylori gastritis.  Treated with Prevpac.   ESOPHAGOGASTRODUODENOSCOPY (EGD) WITH PROPOFOL  N/A 01/28/2024   Procedure: ESOPHAGOGASTRODUODENOSCOPY (EGD) WITH PROPOFOL ;  Surgeon: Cindie Carlin POUR, DO;  Location: [facility];  Service: Endoscopy;  Laterality: N/A;  12:30 pm, asa 2   POLYPECTOMY  01/21/2023   Procedure: POLYPECTOMY;  Surgeon: Cindie Carlin POUR, DO;  Location: [facility];  Service: Endoscopy;;   THYROID SURGERY      Family History  Problem Relation Age of Onset   Kidney disease Sister    Cancer Maternal Aunt    Pancreatic cancer Neg Hx    Pancreatitis Neg Hx     Social History   Socioeconomic History   Marital status: Married    Spouse name: Not on file   Number of children: 1   Years of education: Not on file   Highest education level: 12th grade  Occupational History   Occupation: Retired- Librarian, academic- at Social worker firm in [place]  Tobacco Use   Smoking status: Some Days  Current packs/day: 0.50    Average packs/day: 0.5 packs/day for 10.0 years (5.0 ttl pk-yrs)    Types: Cigarettes    Passive exposure: Current   Smokeless tobacco: Never  Vaping Use   Vaping status: Never Used  Substance and Sexual Activity   Alcohol use: Yes    Alcohol/week: 2.0 standard drinks of alcohol    Types: 2 Glasses of wine per week    Comment: occ   Drug use: Never   Sexual activity: Yes    Birth control/protection: Post-menopausal  Other Topics Concern   Not on file  Social History Narrative   Son lives in [place]; family lives in [place], moved to [place] at age 6   Social Drivers of Health    Financial Resource Strain: Low Risk  (09/11/2023)   Overall Financial Resource Strain (CARDIA)    Difficulty of Paying Living Expenses: Not hard at all  Food Insecurity: No Food Insecurity (09/11/2023)   Hunger Vital Sign    Worried About Running Out of Food in the Last Year: Never true    Ran Out of Food in the Last Year: Never true  Transportation Needs: No Transportation Needs (09/11/2023)   PRAPARE - Administrator, Civil Service (Medical): No    Lack of Transportation (Non-Medical): No  Physical Activity: Insufficiently Active (09/11/2023)   Exercise Vital Sign    Days of Exercise per Week: 2 days    Minutes of Exercise per Session: 20 min  Stress: No Stress Concern Present (09/11/2023)   [employer] of Occupational Health - Occupational Stress Questionnaire    Feeling of Stress : Only a little  Social Connections: Moderately Integrated (09/11/2023)   Social Connection and Isolation Panel    Frequency of Communication with Friends and Family: More than three times a week    Frequency of Social Gatherings with Friends and Family: More than three times a week    Attends Religious Services: More than 4 times per year    Active Member of [employer] or Organizations: No    Attends Banker Meetings: Never    Marital Status: Married  Catering manager Violence: Not At Risk (09/11/2023)   Humiliation, Afraid, Rape, and Kick questionnaire    Fear of Current or Ex-Partner: No    Emotionally Abused: No    Physically Abused: No    Sexually Abused: No    Outpatient Medications Prior to Visit  Medication Sig Dispense Refill   acetaminophen  (TYLENOL ) 500 MG tablet Take 1,000 mg by mouth every 6 (six) hours as needed for moderate pain.     albuterol  (VENTOLIN  HFA) 108 (90 Base) MCG/ACT inhaler INHALE 2 PUFFS BY MOUTH EVERY 6 HOURS AS NEEDED FOR WHEEZING OR SHORTNESS OF BREATH 18 g 0   calcium  carbonate (TUMS - DOSED IN MG ELEMENTAL CALCIUM ) 500 MG chewable  tablet Chew 2 tablets by mouth daily as needed for indigestion or heartburn.     cholecalciferol (VITAMIN D ) 25 MCG (1000 UNIT) tablet Take 2,000 Units by mouth daily.     levothyroxine  (SYNTHROID ) 150 MCG tablet Take 1 tablet (150 mcg total) by mouth daily before breakfast. TAKE 1 TABLET BY MOUTH ONCE DAILY BEFORE BREAKFAST - DOSE CHANGE 90 tablet 0   ondansetron  (ZOFRAN ) 4 MG tablet Take 1 tablet (4 mg total) by mouth every 6 (six) hours. As needed for nausea/vomiting 12 tablet 0   sucralfate  (CARAFATE ) 1 g tablet Take 1 tablet (1 g total) by mouth 4 (four) times daily -  with meals and at bedtime. 120 tablet 0   Vonoprazan Fumarate  (VOQUEZNA ) 20 MG TABS Take 1 tablet by mouth once daily 90 tablet 3   No facility-administered medications prior to visit.    No Known Allergies  ROS Review of Systems  Constitutional:  Negative for chills and fever.  HENT:  Positive for dental problem. Negative for congestion, sinus pressure, sinus pain and sore throat.   Eyes:  Negative for pain and discharge.  Respiratory:  Negative for cough and shortness of breath.   Cardiovascular:  Negative for chest pain and palpitations.  Gastrointestinal:  Negative for diarrhea, nausea and vomiting.  Endocrine: Negative for polydipsia and polyuria.  Genitourinary:  Negative for dysuria and hematuria.  Musculoskeletal:  Positive for arthralgias (Right shoulder). Negative for neck pain and neck stiffness.  Skin:  Negative for rash.  Neurological:  Negative for dizziness and weakness.  Psychiatric/Behavioral:  Negative for agitation and behavioral problems.       Objective:    Physical Exam Vitals reviewed.  Constitutional:      General: She is not in acute distress.    Appearance: She is not diaphoretic.  HENT:     Head: Normocephalic and atraumatic.     Nose: Nose normal. No congestion.     Mouth/Throat:     Mouth: Mucous membranes are moist.     Dentition: Dental abscesses (Left upper premolar tooth)  present.     Pharynx: No posterior oropharyngeal erythema.  Eyes:     General: No scleral icterus.    Extraocular Movements: Extraocular movements intact.  Neck:     Thyroid: No thyromegaly.  Cardiovascular:     Rate and Rhythm: Normal rate and regular rhythm.     Heart sounds: Normal heart sounds. No murmur heard. Pulmonary:     Breath sounds: Normal breath sounds. No wheezing or rales.  Musculoskeletal:     Cervical back: Neck supple. No tenderness.     Right lower leg: No edema.     Left lower leg: No edema.  Skin:    General: Skin is warm.     Findings: No rash.  Neurological:     General: No focal deficit present.     Mental Status: She is alert and oriented to person, place, and time.  Psychiatric:        Mood and Affect: Mood normal.        Behavior: Behavior normal.     BP 136/70 (BP Location: Left Arm) Comment: At home  Pulse 74   Ht 5' 6 (1.676 m)   Wt 141 lb 3.2 oz (64 kg)   SpO2 99%   BMI 22.79 kg/m  Wt Readings from Last 3 Encounters:  07/15/24 141 lb 3.2 oz (64 kg)  01/30/24 134 lb 3.2 oz (60.9 kg)  01/28/24 134 lb (60.8 kg)    Lab Results  Component Value Date   TSH 0.273 (L) 10/02/2023   Lab Results  Component Value Date   WBC 9.3 12/10/2023   HGB 12.3 12/10/2023   HCT 37.6 12/10/2023   MCV 92.2 12/10/2023   PLT 253 12/10/2023   Lab Results  Component Value Date   NA 136 12/10/2023   K 3.2 (L) 12/10/2023   CO2 25 12/10/2023   GLUCOSE 105 (H) 12/10/2023   BUN 22 12/10/2023   CREATININE 1.04 (H) 12/10/2023   BILITOT 0.5 12/10/2023   ALKPHOS 63 12/10/2023   AST 19 12/10/2023   ALT 13 12/10/2023   PROT 6.8 12/10/2023  ALBUMIN 3.6 12/10/2023   CALCIUM  7.8 (L) 12/10/2023   ANIONGAP 7 12/10/2023   EGFR 73 06/05/2023   Lab Results  Component Value Date   CHOL 197 01/09/2023   Lab Results  Component Value Date   HDL 57 01/09/2023   Lab Results  Component Value Date   LDLCALC 123 (H) 01/09/2023   Lab Results  Component Value  Date   TRIG 94 01/09/2023   Lab Results  Component Value Date   CHOLHDL 3.5 01/09/2023   No results found for: HGBA1C    Assessment & Plan:   Problem List Items Addressed This Visit       Cardiovascular and Mediastinum   Essential hypertension   BP Readings from Last 1 Encounters:  07/15/24 136/70   Elevated today, likely due to pain Usually well-controlled at home with lifestyle modification Could be due to oversupplemented thyroid as well Advised DASH diet and moderate exercise/walking, at least 150 mins/week        Digestive   Dental abscess   Started empiric Augmentin  Tylenol  as needed for mild to moderate pain, Percocet as needed for severe pain Advised to contact dentist offices in [place]      Relevant Medications   amoxicillin -clavulanate (AUGMENTIN ) 875-125 MG tablet   oxyCODONE -acetaminophen  (PERCOCET/ROXICET) 5-325 MG tablet     Endocrine   Acquired hypothyroidism - Primary   Lab Results  Component Value Date   TSH 0.273 (L) 10/02/2023    Was oversupplemented On levothyroxine  150 mcg QD, but had been taking 200 mcg dose due to pharmacy refill error till 01/25 Check TSH, free T4, adjust dose of levothyroxine  based on blood tests        Other   Mixed hyperlipidemia   Had not started treatment as her thyroid levels were abnormal in the past Recheck lipid profile - LDL was improving in 2024        Meds ordered this encounter  Medications   amoxicillin -clavulanate (AUGMENTIN ) 875-125 MG tablet    Sig: Take 1 tablet by mouth 2 (two) times daily.    Dispense:  14 tablet    Refill:  0   oxyCODONE -acetaminophen  (PERCOCET/ROXICET) 5-325 MG tablet    Sig: Take 1 tablet by mouth every 6 (six) hours as needed for up to 5 days for severe pain (pain score 7-10).    Dispense:  20 tablet    Refill:  0    Follow-up: Return in about 4 months (around 11/14/2024) for Hypothyroidism.    Suzzane MARLA Blanch, MD

## 2024-07-15 NOTE — Assessment & Plan Note (Signed)
 BP Readings from Last 1 Encounters:  07/15/24 136/70   Elevated today, likely due to pain Usually well-controlled at home with lifestyle modification Could be due to oversupplemented thyroid as well Advised DASH diet and moderate exercise/walking, at least 150 mins/week

## 2024-07-16 ENCOUNTER — Other Ambulatory Visit: Payer: Self-pay | Admitting: Internal Medicine

## 2024-07-16 ENCOUNTER — Ambulatory Visit: Payer: Self-pay | Admitting: Internal Medicine

## 2024-07-16 DIAGNOSIS — E039 Hypothyroidism, unspecified: Secondary | ICD-10-CM

## 2024-07-16 LAB — CMP14+EGFR
ALT: 23 IU/L (ref 0–32)
AST: 26 IU/L (ref 0–40)
Albumin: 4.6 g/dL (ref 3.9–4.9)
Alkaline Phosphatase: 96 IU/L (ref 44–121)
BUN/Creatinine Ratio: 16 (ref 12–28)
BUN: 15 mg/dL (ref 8–27)
Bilirubin Total: 0.4 mg/dL (ref 0.0–1.2)
CO2: 26 mmol/L (ref 20–29)
Calcium: 8.6 mg/dL — ABNORMAL LOW (ref 8.7–10.3)
Chloride: 98 mmol/L (ref 96–106)
Creatinine, Ser: 0.93 mg/dL (ref 0.57–1.00)
Globulin, Total: 3.2 g/dL (ref 1.5–4.5)
Glucose: 87 mg/dL (ref 70–99)
Potassium: 3.5 mmol/L (ref 3.5–5.2)
Sodium: 140 mmol/L (ref 134–144)
Total Protein: 7.8 g/dL (ref 6.0–8.5)
eGFR: 67 mL/min/1.73 (ref >59)

## 2024-07-16 LAB — LIPID PANEL
Chol/HDL Ratio: 3.6 ratio (ref 0.0–4.4)
Cholesterol, Total: 245 mg/dL — ABNORMAL HIGH (ref 100–199)
HDL: 69 mg/dL (ref >39)
LDL Chol Calc (NIH): 146 mg/dL — ABNORMAL HIGH (ref 0–99)
Triglycerides: 167 mg/dL — ABNORMAL HIGH (ref 0–149)
VLDL Cholesterol Cal: 30 mg/dL (ref 5–40)

## 2024-07-16 LAB — TSH+FREE T4
Free T4: 0.77 ng/dL — ABNORMAL LOW (ref 0.82–1.77)
TSH: 51.8 u[IU]/mL — ABNORMAL HIGH (ref 0.450–4.500)

## 2024-07-16 LAB — VITAMIN D 25 HYDROXY (VIT D DEFICIENCY, FRACTURES): Vit D, 25-Hydroxy: 22.7 ng/mL — ABNORMAL LOW (ref 30.0–100.0)

## 2024-09-09 ENCOUNTER — Ambulatory Visit (INDEPENDENT_AMBULATORY_CARE_PROVIDER_SITE_OTHER)

## 2024-09-09 VITALS — BP 126/76 | HR 64 | Resp 12 | Ht 66.0 in | Wt 140.0 lb

## 2024-09-09 DIAGNOSIS — Z780 Asymptomatic menopausal state: Secondary | ICD-10-CM

## 2024-09-09 DIAGNOSIS — Z2821 Immunization not carried out because of patient refusal: Secondary | ICD-10-CM | POA: Diagnosis not present

## 2024-09-09 DIAGNOSIS — Z1231 Encounter for screening mammogram for malignant neoplasm of breast: Secondary | ICD-10-CM | POA: Diagnosis not present

## 2024-09-09 DIAGNOSIS — Z0000 Encounter for general adult medical examination without abnormal findings: Secondary | ICD-10-CM

## 2024-09-09 NOTE — Progress Notes (Signed)
 Subjective:   Ann Reeves is a 68 y.o. who presents for a Medicare Wellness preventive visit.  As a reminder, Annual Wellness Visits don't include a physical exam, and some assessments may be limited, especially if this visit is performed virtually. We may recommend an in-person follow-up visit with your provider if needed.  Visit Complete: In person  Persons Participating in Visit: Patient.  AWV Questionnaire: No: Patient Medicare AWV questionnaire was not completed prior to this visit.  Cardiac Risk Factors include: advanced age (>80men, >98 women);dyslipidemia;hypertension;smoking/ tobacco exposure     Objective:    Today's Vitals   09/09/24 1702  BP: 126/76  Pulse: 64  Resp: 12  Weight: 140 lb (63.5 kg)  Height: 5' 6 (1.676 m)  PainSc: 0-No pain   Body mass index is 22.6 kg/m.     09/09/2024    5:02 PM 01/28/2024   11:11 AM 12/10/2023    9:14 PM 09/11/2023    8:06 AM 01/21/2023    7:40 AM 12/17/2022    9:44 AM 08/21/2022   11:23 AM  Advanced Directives  Does Patient Have a Medical Advance Directive? No No No No No No No  Would patient like information on creating a medical advance directive? No - Patient declined No - Patient declined No - Patient declined Yes (MAU/Ambulatory/Procedural Areas - Information given) No - Patient declined  No - Patient declined    Current Medications (verified) Outpatient Encounter Medications as of 09/09/2024  Medication Sig   acetaminophen  (TYLENOL ) 500 MG tablet Take 1,000 mg by mouth every 6 (six) hours as needed for moderate pain.   albuterol  (VENTOLIN  HFA) 108 (90 Base) MCG/ACT inhaler INHALE 2 PUFFS BY MOUTH EVERY 6 HOURS AS NEEDED FOR WHEEZING OR SHORTNESS OF BREATH   amoxicillin -clavulanate (AUGMENTIN ) 875-125 MG tablet Take 1 tablet by mouth 2 (two) times daily.   calcium  carbonate (TUMS - DOSED IN MG ELEMENTAL CALCIUM ) 500 MG chewable tablet Chew 2 tablets by mouth daily as needed for indigestion or heartburn.    cholecalciferol (VITAMIN D ) 25 MCG (1000 UNIT) tablet Take 2,000 Units by mouth daily.   levothyroxine  (SYNTHROID ) 150 MCG tablet Take 1 tablet (150 mcg total) by mouth daily before breakfast. TAKE 1 TABLET BY MOUTH ONCE DAILY BEFORE BREAKFAST - DOSE CHANGE   ondansetron  (ZOFRAN ) 4 MG tablet Take 1 tablet (4 mg total) by mouth every 6 (six) hours. As needed for nausea/vomiting   sucralfate  (CARAFATE ) 1 g tablet Take 1 tablet (1 g total) by mouth 4 (four) times daily -  with meals and at bedtime.   Vonoprazan Fumarate  (VOQUEZNA ) 20 MG TABS Take 1 tablet by mouth once daily   No facility-administered encounter medications on file as of 09/09/2024.    Allergies (verified) Patient has no known allergies.   History: Past Medical History:  Diagnosis Date   GERD (gastroesophageal reflux disease)    Helicobacter pylori gastritis 02/2021   Treated with Prevpac.  Documented eradication in December 2022.   Hypertension    Hyperthyroidism- had goiter removed in 1998    Mixed hyperlipidemia 08/27/2022   Past Surgical History:  Procedure Laterality Date   BIOPSY  02/27/2021   Procedure: BIOPSY;  Surgeon: Cindie Carlin POUR, DO;  [facility];  Service: Endoscopy;;  gastric   COLONOSCOPY WITH PROPOFOL  N/A 01/21/2023   Procedure: COLONOSCOPY WITH PROPOFOL ;  Surgeon: Cindie Carlin POUR, DO;  Location: [facility];  Service: Endoscopy;  Laterality: N/A;  900am, asa 2   ESOPHAGOGASTRODUODENOSCOPY (EGD)  WITH PROPOFOL  N/A 02/27/2021   Surgeon: Cindie Carlin POUR, DO; H. pylori gastritis.  Treated with Prevpac.   ESOPHAGOGASTRODUODENOSCOPY (EGD) WITH PROPOFOL  N/A 01/28/2024   Procedure: ESOPHAGOGASTRODUODENOSCOPY (EGD) WITH PROPOFOL ;  Surgeon: Cindie Carlin POUR, DO;  Location: [facility];  Service: Endoscopy;  Laterality: N/A;  12:30 pm, asa 2   POLYPECTOMY  01/21/2023   Procedure: POLYPECTOMY;  Surgeon: Cindie Carlin POUR, DO;  Location: [facility];  Service: Endoscopy;;   THYROID SURGERY      Family History  Problem Relation Age of Onset   Kidney disease Sister    Cancer Maternal Aunt    Pancreatic cancer Neg Hx    Pancreatitis Neg Hx    Social History   Socioeconomic History   Marital status: Married    Spouse name: Not on file   Number of children: 1   Years of education: Not on file   Highest education level: 12th grade  Occupational History   Occupation: RetiredNurse, 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 [place]  Tobacco Use   Smoking status: Some Days    Current packs/day: 0.50    Average packs/day: 0.5 packs/day for 10.0 years (5.0 ttl pk-yrs)    Types: Cigarettes    Passive exposure: Current   Smokeless tobacco: Never  Vaping Use   Vaping status: Never Used  Substance and Sexual Activity   Alcohol use: Yes    Alcohol/week: 2.0 standard drinks of alcohol    Types: 2 Glasses of wine per week    Comment: occ   Drug use: Never   Sexual activity: Yes    Birth control/protection: Post-menopausal  Other Topics Concern   Not on file  Social History Narrative   Son lives in [place]; family lives in [place], moved to [place] at age 58   Social Drivers of Health   Financial Resource Strain: Low Risk  (09/09/2024)   Overall Financial Resource Strain (CARDIA)    Difficulty of Paying Living Expenses: Not hard at all  Food Insecurity: No Food Insecurity (09/09/2024)   Hunger Vital Sign    Worried About Running Out of Food in the Last Year: Never true    Ran Out of Food in the Last Year: Never true  Transportation Needs: No Transportation Needs (09/09/2024)   PRAPARE - Administrator, Civil Service (Medical): No    Lack of Transportation (Non-Medical): No  Physical Activity: Insufficiently Active (09/09/2024)   Exercise Vital Sign    Days of Exercise per Week: 2 days    Minutes of Exercise per Session: 20 min  Stress: No Stress Concern Present (09/09/2024)   [employer] of Occupational Health - Occupational Stress Questionnaire    Feeling of Stress: Not  at all  Social Connections: Moderately Integrated (09/09/2024)   Social Connection and Isolation Panel    Frequency of Communication with Friends and Family: More than three times a week    Frequency of Social Gatherings with Friends and Family: More than three times a week    Attends Religious Services: More than 4 times per year    Active Member of [employer] or Organizations: No    Attends Banker Meetings: Never    Marital Status: Married    Tobacco Counseling Ready to quit: Yes Counseling given: Yes    Clinical Intake:  Pre-visit preparation completed: Yes  Pain : No/denies pain Pain Score: 0-No pain     BMI - recorded: 22.6 Nutritional Status: BMI of 19-24  Normal Nutritional Risks:  None Diabetes: No  No results found for: HGBA1C   How often do you need to have someone help you when you read instructions, pamphlets, or other written materials from your doctor or pharmacy?: 1 - Never  Interpreter Needed?: No  Information entered by :: Seraya Jobst W CMA (AAMA)   Activities of Daily Living     09/09/2024    5:04 PM 09/11/2023    8:16 AM  In your present state of health, do you have any difficulty performing the following activities:  Hearing? 0 0  Vision? 0 0  Difficulty concentrating or making decisions? 0 0  Walking or climbing stairs? 0 0  Dressing or bathing? 0 0  Doing errands, shopping? 0 0  Preparing Food and eating ? N N  Using the Toilet? N N  In the past six months, have you accidently leaked urine? N N  Do you have problems with loss of bowel control? N N  Managing your Medications? N N  Managing your Finances? N N  Housekeeping or managing your Housekeeping? N N    Patient Care Team: Tobie Suzzane POUR, MD as PCP - General (Internal Medicine) Cindie Carlin POUR, DO as Consulting Physician (Internal Medicine)  I have updated your Care Teams any recent Medical Services you may have received from other providers in the past year.      Assessment:   This is a routine wellness examination for Ann Reeves.  Hearing/Vision screen Hearing Screening - Comments:: Patient denies any hearing difficulties.   Vision Screening - Comments:: Wears rx glasses - up to date with routine eye exams. Patient can't recall name of provider   Goals Addressed             This Visit's Progress    Patient Stated   On track    PATIENT WOULD LIKE TO START WALKING MORE         Depression Screen     09/09/2024    5:04 PM 07/15/2024    3:31 PM 01/30/2024   10:32 AM 12/12/2023    4:23 PM 10/02/2023    2:20 PM 09/11/2023    8:10 AM 06/05/2023   10:26 AM  PHQ 2/9 Scores  PHQ - 2 Score 0 0 0 0 0 0 0  PHQ- 9 Score 0 0 0 0 2 3 1      Fall Risk     09/09/2024    5:03 PM 07/15/2024    3:31 PM 01/30/2024   10:32 AM 12/12/2023    4:23 PM 10/02/2023    2:20 PM  Fall Risk   Falls in the past year? 0 0 1 1 1   Number falls in past yr: 0 0 0 0 1  Injury with Fall? 0 0 0 0 0  Risk for fall due to : No Fall Risks No Fall Risks History of fall(s) No Fall Risks No Fall Risks  Follow up Falls evaluation completed;Education provided;Falls prevention discussed Falls evaluation completed Falls evaluation completed Falls evaluation completed Falls evaluation completed    MEDICARE RISK AT HOME:  Medicare Risk at Home Any stairs in or around the home?: Yes If so, are there any without handrails?: No Home free of loose throw rugs in walkways, pet beds, electrical cords, etc?: Yes Adequate lighting in your home to reduce risk of falls?: Yes Life alert?: No Use of a cane, walker or w/c?: No Grab bars in the bathroom?: Yes Shower chair or bench in shower?: Yes Elevated toilet seat or a handicapped  toilet?: No  TIMED UP AND GO:  Was the test performed?  Yes  Length of time to ambulate 10 feet: 5 sec Gait steady and fast without use of assistive device  Cognitive Function: 6CIT completed    08/21/2022   11:25 AM 08/18/2021    4:07 PM  MMSE - Mini  Mental State Exam  Not completed: Unable to complete Unable to complete        09/09/2024    5:04 PM 09/11/2023    8:10 AM 08/21/2022   11:25 AM 08/18/2021    4:07 PM  6CIT Screen  What Year? 0 points 0 points 0 points 0 points  What month? 0 points 0 points 0 points 0 points  What time? 0 points 0 points 0 points 0 points  Count back from 20 0 points 0 points 0 points 0 points  Months in reverse 0 points 0 points 0 points 0 points  Repeat phrase 0 points 0 points 0 points 0 points  Total Score 0 points 0 points 0 points 0 points    Immunizations Immunization History  Administered Date(s) Administered   Fluad Quad(high Dose 65+) 09/18/2023   Moderna SARS-COV2 Booster Vaccination 01/08/2020, 02/06/2020, 11/07/2020   PNEUMOCOCCAL CONJUGATE-20 02/01/2022   Tdap 08/11/2017   Unspecified SARS-COV-2 Vaccination 09/18/2023    Screening Tests Health Maintenance  Topic Date Due   Zoster Vaccines- Shingrix (1 of 2) Never done   Influenza Vaccine  06/26/2024   COVID-19 Vaccine (2 - 2025-26 season) 07/27/2024   DEXA SCAN  09/07/2024   Mammogram  09/10/2024   Medicare Annual Wellness (AWV)  09/09/2025   DTaP/Tdap/Td (2 - Td or Tdap) 08/12/2027   Colonoscopy  01/22/2028   Pneumococcal Vaccine: 50+ Years  Completed   Hepatitis C Screening  Completed   Meningococcal B Vaccine  Aged Out    Health Maintenance Health Maintenance Due  Topic Date Due   Zoster Vaccines- Shingrix (1 of 2) Never done   Influenza Vaccine  06/26/2024   COVID-19 Vaccine (2 - 2025-26 season) 07/27/2024   DEXA SCAN  09/07/2024   Mammogram  09/10/2024   Health Maintenance Items Addressed: Mammogram ordered, DEXA ordered  Additional Screening:  Vision Screening: Recommended annual ophthalmology exams for early detection of glaucoma and other disorders of the eye. Would you like a referral to an eye doctor? No    Dental Screening: Recommended annual dental exams for proper oral hygiene  Community  Resource Referral / Chronic Care Management: CRR required this visit?  No   CCM required this visit?  No   Plan:    I have personally reviewed and noted the following in the patient's chart:   Medical and social history Use of alcohol, tobacco or illicit drugs  Current medications and supplements including opioid prescriptions. Patient is not currently taking opioid prescriptions. Functional ability and status Nutritional status Physical activity Advanced directives List of other physicians Hospitalizations, surgeries, and ER visits in previous 12 months Vitals Screenings to include cognitive, depression, and falls Referrals and appointments  In addition, I have reviewed and discussed with patient certain preventive protocols, quality metrics, and best practice recommendations. A written personalized care plan for preventive services as well as general preventive health recommendations were provided to patient.   Cherisse Carrell, CMA   09/09/2024   After Visit Summary: (MyChart) Due to this being a telephonic visit, the after visit summary with patients personalized plan was offered to patient via MyChart   Notes: Nothing significant to report  at this time.

## 2024-09-09 NOTE — Patient Instructions (Signed)
 [name],  Thank you for taking the time for your Medicare Wellness Visit. I appreciate your continued commitment to your health goals. Please review the care plan we discussed, and feel free to reach out if I can assist you further.  Medicare recommends these wellness visits once per year to help you and your care team stay ahead of potential health issues. These visits are designed to focus on prevention, allowing your provider to concentrate on managing your acute and chronic conditions during your regular appointments.  Please note that Annual Wellness Visits do not include a physical exam. Some assessments may be limited, especially if the visit was conducted virtually. If needed, we may recommend a separate in-person follow-up with your provider.  Ongoing Care  Seeing your primary care provider every 3 to 6 months helps us  monitor your health and provide consistent, personalized care.   Referrals   Mammogram/Bone Density Screening: Call [facility] @ Phone: [phone]   Recommended Screenings:  Health Maintenance  Topic Date Due   Zoster (Shingles) Vaccine (1 of 2) Never done   Flu Shot  06/26/2024   COVID-19 Vaccine (2 - 2025-26 season) 07/27/2024   DEXA scan (bone density measurement)  09/07/2024   Breast Cancer Screening  09/10/2024   Medicare Annual Wellness Visit  09/09/2025   DTaP/Tdap/Td vaccine (2 - Td or Tdap) 08/12/2027   Colon Cancer Screening  01/22/2028   Pneumococcal Vaccine for age over 6  Completed   Hepatitis C Screening  Completed   Meningitis B Vaccine  Aged Out       09/09/2024    5:02 PM  Advanced Directives  Does Patient Have a Medical Advance Directive? No  Would patient like information on creating a medical advance directive? No - Patient declined    Advance Care Planning is important because it: Ensures you receive medical care that aligns with your values, goals, and preferences. Provides guidance to your family and loved  ones, reducing the emotional burden of decision-making during critical moments.  Vision: Annual vision screenings are recommended for early detection of glaucoma, cataracts, and diabetic retinopathy. These exams can also reveal signs of chronic conditions such as diabetes and high blood pressure.  Dental: Annual dental screenings help detect early signs of oral cancer, gum disease, and other conditions linked to overall health, including heart disease and diabetes.  Please see the attached documents for additional preventive care recommendations.

## 2024-09-25 ENCOUNTER — Ambulatory Visit (HOSPITAL_COMMUNITY)
Admission: RE | Admit: 2024-09-25 | Discharge: 2024-09-25 | Disposition: A | Discharge status: Home / Self Care | Source: Ambulatory Visit | Attending: Internal Medicine | Admitting: Internal Medicine

## 2024-09-25 ENCOUNTER — Encounter (HOSPITAL_COMMUNITY): Payer: Self-pay

## 2024-09-25 DIAGNOSIS — Z1231 Encounter for screening mammogram for malignant neoplasm of breast: Secondary | ICD-10-CM | POA: Insufficient documentation

## 2024-09-25 DIAGNOSIS — Z780 Asymptomatic menopausal state: Secondary | ICD-10-CM | POA: Diagnosis present

## 2024-09-27 ENCOUNTER — Ambulatory Visit: Payer: Self-pay | Admitting: Internal Medicine

## 2024-10-05 ENCOUNTER — Other Ambulatory Visit: Payer: Self-pay | Admitting: Internal Medicine

## 2024-11-12 ENCOUNTER — Encounter: Payer: Self-pay | Admitting: Internal Medicine

## 2024-11-12 ENCOUNTER — Ambulatory Visit: Admitting: Internal Medicine

## 2024-11-12 VITALS — BP 111/70 | HR 76 | Ht 66.0 in | Wt 130.0 lb

## 2024-11-12 DIAGNOSIS — E039 Hypothyroidism, unspecified: Secondary | ICD-10-CM | POA: Diagnosis not present

## 2024-11-12 DIAGNOSIS — R634 Abnormal weight loss: Secondary | ICD-10-CM | POA: Insufficient documentation

## 2024-11-12 DIAGNOSIS — Z23 Encounter for immunization: Secondary | ICD-10-CM

## 2024-11-12 DIAGNOSIS — K279 Peptic ulcer, site unspecified, unspecified as acute or chronic, without hemorrhage or perforation: Secondary | ICD-10-CM

## 2024-11-12 DIAGNOSIS — J4520 Mild intermittent asthma, uncomplicated: Secondary | ICD-10-CM

## 2024-11-12 DIAGNOSIS — I10 Essential (primary) hypertension: Secondary | ICD-10-CM

## 2024-11-12 DIAGNOSIS — J45909 Unspecified asthma, uncomplicated: Secondary | ICD-10-CM | POA: Insufficient documentation

## 2024-11-12 MED ORDER — SUCRALFATE 1 G PO TABS: 1.0000 g | ORAL_TABLET | Freq: Three times a day (TID) | ORAL | 1 refills | Status: AC

## 2024-11-12 MED ORDER — ALBUTEROL SULFATE HFA 108 (90 BASE) MCG/ACT IN AERS: 2.0000 | INHALATION_SPRAY | Freq: Four times a day (QID) | RESPIRATORY_TRACT | 1 refills | Status: DC | PRN

## 2024-11-12 NOTE — Assessment & Plan Note (Addendum)
 Lab Results  Component Value Date   TSH 51.800 (H) 07/15/2024   Had dental abscess around last blood test Was oversupplemented previously On levothyroxine  150 mcg once daily Was supposed to get repeat TSH, free T4 in 09/25, will get it done today - adjust dose of levothyroxine  based on blood tests

## 2024-11-12 NOTE — Assessment & Plan Note (Signed)
 Unintentional weight loss, likely due to decreased p.o. intake No other B symptoms Needs to follow-up with GI for GERD Advised to take protein supplement regularly

## 2024-11-12 NOTE — Assessment & Plan Note (Signed)
 BP Readings from Last 1 Encounters:  11/12/24 111/70   Usually well-controlled at home with lifestyle modification Advised DASH diet and moderate exercise/walking, at least 150 mins/week

## 2024-11-12 NOTE — Assessment & Plan Note (Signed)
 Intermittent dyspnea, usually seasonal Cold air exposure may have caused wheezing Usually improves with albuterol  inhaler, as needed for dyspnea or wheezing - refilled

## 2024-11-12 NOTE — Assessment & Plan Note (Addendum)
 Better controlled now with Voquezna  Has tried omeprazole, Dexilant  and rabeprazole  Needs to avoid NSAIDs, advised to take Tylenol  or Voltaren gel for arthritis Followed by Dr. Cindie - on Voquezna , needs to take it regularly, advised to follow up with GI Refilled sucralfate  1 gm tablets - advised to take before meals for 1 week and then PRN

## 2024-11-12 NOTE — Progress Notes (Signed)
 Established Patient Office Visit  Subjective:  Patient ID: Ann Reeves, female    DOB: [birth year]  Age: 68 y.o. MRN: [id number]  CC:  Chief Complaint  Patient presents with   Hypothyroidism    Four month follow up    HPI Ann Reeves is a 68 y.o. female with past medical history of GERD, hypothyroidism and sciatica who presents for f/u of her chronic medical conditions.  GERD: She has been feeling better with Voquezna  now, but still has abdominal pain with food intake.  She has run out of sucralfate .  She has been treated for H. pylori in the past.  She denies any dysphagia or odynophagia currently.  Hypothyroidism: Her TSH was elevated and her free T4 was low in 08/25. She has not had blood tests yet. She has been taking levothyroxine  150 mcg daily since 01/25. Denies any tremors or palpitations currently.  She has lost 11 lbs weight since the last visit, which she attributes to inability to tolerate likeable food due to GERD.  Denies overt fatigue, palpitations or tremors.   Past Medical History:  Diagnosis Date   GERD (gastroesophageal reflux disease)    Helicobacter pylori gastritis 02/2021   Treated with Prevpac.  Documented eradication in December 2022.   Hypertension    Hyperthyroidism- had goiter removed in 1998    Mixed hyperlipidemia 08/27/2022   Reactive airway disease 11/12/2024    Past Surgical History:  Procedure Laterality Date   BIOPSY  02/27/2021   Procedure: BIOPSY;  Surgeon: Cindie Carlin POUR, DO;  [facility];  Service: Endoscopy;;  gastric   COLONOSCOPY WITH PROPOFOL  N/A 01/21/2023   Procedure: COLONOSCOPY WITH PROPOFOL ;  Surgeon: Cindie Carlin POUR, DO;  [facility]: [facility];  Service: Endoscopy;  Laterality: N/A;  900am, asa 2   ESOPHAGOGASTRODUODENOSCOPY (EGD) WITH PROPOFOL  N/A 02/27/2021   Surgeon: Cindie Carlin POUR, DO; H. pylori gastritis.  Treated with Prevpac.   ESOPHAGOGASTRODUODENOSCOPY (EGD) WITH PROPOFOL  N/A 01/28/2024    Procedure: ESOPHAGOGASTRODUODENOSCOPY (EGD) WITH PROPOFOL ;  Surgeon: Cindie Carlin POUR, DO;  [facility];  Service: Endoscopy;  Laterality: N/A;  12:30 pm, asa 2   POLYPECTOMY  01/21/2023   Procedure: POLYPECTOMY;  Surgeon: Cindie Carlin POUR, DO;  [facility]: [facility];  Service: Endoscopy;;   THYROID SURGERY      Family History  Problem Relation Age of Onset   Kidney disease Sister    Cancer Maternal Aunt    Pancreatic cancer Neg Hx    Pancreatitis Neg Hx     Social History   Socioeconomic History   Marital status: Married    Spouse name: Not on file   Number of children: 1   Years of education: Not on file   Highest education level: 12th grade  Occupational History   Occupation: RetiredNurse, 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 [place]  Tobacco Use   Smoking status: Some Days    Current packs/day: 0.50    Average packs/day: 0.5 packs/day for 10.0 years (5.0 ttl pk-yrs)    Types: Cigarettes    Passive exposure: Current   Smokeless tobacco: Never  Vaping Use   Vaping status: Never Used  Substance and Sexual Activity   Alcohol use: Yes    Alcohol/week: 2.0 standard drinks of alcohol    Types: 2 Glasses of wine per week    Comment: occ   Drug use: Never   Sexual activity: Yes    Birth control/protection: Post-menopausal  Other Topics Concern   Not  on file  Social History Narrative   Son lives in [place]; family lives in [place], moved to [place] at age 82   Social Drivers of Health   Tobacco Use: High Risk (11/12/2024)   Patient History    Smoking Tobacco Use: Some Days    Smokeless Tobacco Use: Never    Passive Exposure: Current  Financial Resource Strain: Low Risk (09/09/2024)   Overall Financial Resource Strain (CARDIA)    Difficulty of Paying Living Expenses: Not hard at all  Food Insecurity: No Food Insecurity (09/09/2024)   Epic    Worried About Programme Researcher, Broadcasting/film/video in the Last Year: Never true    Ran Out of Food in the Last Year: Never true  Transportation  Needs: No Transportation Needs (09/09/2024)   Epic    Lack of Transportation (Medical): No    Lack of Transportation (Non-Medical): No  Physical Activity: Insufficiently Active (09/09/2024)   Exercise Vital Sign    Days of Exercise per Week: 2 days    Minutes of Exercise per Session: 20 min  Stress: No Stress Concern Present (09/09/2024)   [employer] of Occupational Health - Occupational Stress Questionnaire    Feeling of Stress: Not at all  Social Connections: Moderately Integrated (09/09/2024)   Social Connection and Isolation Panel    Frequency of Communication with Friends and Family: More than three times a week    Frequency of Social Gatherings with Friends and Family: More than three times a week    Attends Religious Services: More than 4 times per year    Active Member of Clubs or Organizations: No    Attends Banker Meetings: Never    Marital Status: Married  Catering Manager Violence: Not At Risk (09/09/2024)   Epic    Fear of Current or Ex-Partner: No    Emotionally Abused: No    Physically Abused: No    Sexually Abused: No  Depression (PHQ2-9): Low Risk (11/12/2024)   Depression (PHQ2-9)    PHQ-2 Score: 0  Alcohol Screen: Low Risk (09/09/2024)   Alcohol Screen    Last Alcohol Screening Score (AUDIT): 0  Housing: Low Risk (09/09/2024)   Epic    Unable to Pay for Housing in the Last Year: No    Number of Times Moved in the Last Year: 0    Homeless in the Last Year: No  Utilities: Not At Risk (09/09/2024)   Epic    Threatened with loss of utilities: No  Health Literacy: Adequate Health Literacy (09/09/2024)   B1300 Health Literacy    Frequency of need for help with medical instructions: Never    Outpatient Medications Prior to Visit  Medication Sig Dispense Refill   acetaminophen  (TYLENOL ) 500 MG tablet Take 1,000 mg by mouth every 6 (six) hours as needed for moderate pain.     calcium  carbonate (TUMS - DOSED IN MG ELEMENTAL CALCIUM ) 500 MG  chewable tablet Chew 2 tablets by mouth daily as needed for indigestion or heartburn.     cholecalciferol (VITAMIN D ) 25 MCG (1000 UNIT) tablet Take 2,000 Units by mouth daily.     levothyroxine  (SYNTHROID ) 150 MCG tablet TAKE 1 TABLET BY MOUTH ONCE DAILY BEFORE BREAKFAST 90 tablet 0   ondansetron  (ZOFRAN ) 4 MG tablet Take 1 tablet (4 mg total) by mouth every 6 (six) hours. As needed for nausea/vomiting 12 tablet 0   Vonoprazan Fumarate  (VOQUEZNA ) 20 MG TABS Take 1 tablet by mouth once daily 90 tablet 3   albuterol  (VENTOLIN   HFA) 108 (90 Base) MCG/ACT inhaler INHALE 2 PUFFS BY MOUTH EVERY 6 HOURS AS NEEDED FOR WHEEZING OR SHORTNESS OF BREATH 18 g 0   amoxicillin -clavulanate (AUGMENTIN ) 875-125 MG tablet Take 1 tablet by mouth 2 (two) times daily. 14 tablet 0   sucralfate  (CARAFATE ) 1 g tablet Take 1 tablet (1 g total) by mouth 4 (four) times daily -  with meals and at bedtime. 120 tablet 0   No facility-administered medications prior to visit.    No Known Allergies  ROS Review of Systems  Constitutional:  Negative for chills and fever.  HENT:  Negative for congestion, sinus pressure, sinus pain and sore throat.   Eyes:  Negative for pain and discharge.  Respiratory:  Negative for cough and shortness of breath.   Cardiovascular:  Negative for chest pain and palpitations.  Gastrointestinal:  Negative for diarrhea, nausea and vomiting.  Endocrine: Negative for polydipsia and polyuria.  Genitourinary:  Negative for dysuria and hematuria.  Musculoskeletal:  Positive for arthralgias (Right shoulder). Negative for neck pain and neck stiffness.  Skin:  Negative for rash.  Neurological:  Negative for dizziness and weakness.  Psychiatric/Behavioral:  Negative for agitation and behavioral problems.       Objective:    Physical Exam Vitals reviewed.  Constitutional:      General: She is not in acute distress.    Appearance: She is not diaphoretic.  HENT:     Head: Normocephalic and  atraumatic.     Nose: Nose normal. No congestion.     Mouth/Throat:     Mouth: Mucous membranes are moist.     Pharynx: No posterior oropharyngeal erythema.  Eyes:     General: No scleral icterus.    Extraocular Movements: Extraocular movements intact.  Neck:     Thyroid: No thyromegaly.  Cardiovascular:     Rate and Rhythm: Normal rate and regular rhythm.     Heart sounds: Normal heart sounds. No murmur heard. Pulmonary:     Breath sounds: Normal breath sounds. No wheezing or rales.  Musculoskeletal:     Cervical back: Neck supple. No tenderness.     Right lower leg: No edema.     Left lower leg: No edema.  Skin:    General: Skin is warm.     Findings: No rash.  Neurological:     General: No focal deficit present.     Mental Status: She is alert and oriented to person, place, and time.  Psychiatric:        Mood and Affect: Mood normal.        Behavior: Behavior normal.     BP 111/70   Pulse 76   Ht 5' 6 (1.676 m)   Wt 130 lb (59 kg)   SpO2 96%   BMI 20.98 kg/m  Wt Readings from Last 3 Encounters:  11/12/24 130 lb (59 kg)  09/09/24 140 lb (63.5 kg)  07/15/24 141 lb 3.2 oz (64 kg)    Lab Results  Component Value Date   TSH 51.800 (H) 07/15/2024   Lab Results  Component Value Date   WBC 9.3 12/10/2023   HGB 12.3 12/10/2023   HCT 37.6 12/10/2023   MCV 92.2 12/10/2023   PLT 253 12/10/2023   Lab Results  Component Value Date   NA 140 07/15/2024   K 3.5 07/15/2024   CO2 26 07/15/2024   GLUCOSE 87 07/15/2024   BUN 15 07/15/2024   CREATININE 0.93 07/15/2024   BILITOT 0.4 07/15/2024  ALKPHOS 96 07/15/2024   AST 26 07/15/2024   ALT 23 07/15/2024   PROT 7.8 07/15/2024   ALBUMIN 4.6 07/15/2024   CALCIUM  8.6 (L) 07/15/2024   ANIONGAP 7 12/10/2023   EGFR 67 07/15/2024   Lab Results  Component Value Date   CHOL 245 (H) 07/15/2024   Lab Results  Component Value Date   HDL 69 07/15/2024   Lab Results  Component Value Date   LDLCALC 146 (H)  07/15/2024   Lab Results  Component Value Date   TRIG 167 (H) 07/15/2024   Lab Results  Component Value Date   CHOLHDL 3.6 07/15/2024   No results found for: HGBA1C    Assessment & Plan:   Problem List Items Addressed This Visit       Cardiovascular and Mediastinum   Essential hypertension - Primary   BP Readings from Last 1 Encounters:  11/12/24 111/70   Usually well-controlled at home with lifestyle modification Advised DASH diet and moderate exercise/walking, at least 150 mins/week      Relevant Orders   CBC with Differential/Platelet   CMP14+EGFR     Respiratory   Reactive airway disease   Intermittent dyspnea, usually seasonal Cold air exposure may have caused wheezing Usually improves with albuterol  inhaler, as needed for dyspnea or wheezing - refilled      Relevant Medications   albuterol  (VENTOLIN  HFA) 108 (90 Base) MCG/ACT inhaler     Digestive   PUD (peptic ulcer disease)   Better controlled now with Voquezna  Has tried omeprazole, Dexilant  and rabeprazole  Needs to avoid NSAIDs, advised to take Tylenol  or Voltaren gel for arthritis Followed by Dr. Cindie - on Voquezna , needs to take it regularly, advised to follow up with GI Refilled sucralfate  1 gm tablets - advised to take before meals for 1 week and then PRN      Relevant Medications   sucralfate  (CARAFATE ) 1 g tablet     Endocrine   Acquired hypothyroidism   Lab Results  Component Value Date   TSH 51.800 (H) 07/15/2024   Had dental abscess around last blood test Was oversupplemented previously On levothyroxine  150 mcg once daily Was supposed to get repeat TSH, free T4 in 09/25, will get it done today - adjust dose of levothyroxine  based on blood tests      Relevant Orders   TSH+T4F+T3Free     Other   Weight loss   Unintentional weight loss, likely due to decreased p.o. intake No other B symptoms Needs to follow-up with GI for GERD Advised to take protein supplement regularly       Relevant Orders   CBC with Differential/Platelet   CMP14+EGFR   Other Visit Diagnoses       Encounter for immunization       Relevant Orders   Flu vaccine HIGH DOSE PF(Fluzone Trivalent) (Completed)         Meds ordered this encounter  Medications   sucralfate  (CARAFATE ) 1 g tablet    Sig: Take 1 tablet (1 g total) by mouth 4 (four) times daily -  with meals and at bedtime.    Dispense:  120 tablet    Refill:  1   albuterol  (VENTOLIN  HFA) 108 (90 Base) MCG/ACT inhaler    Sig: Inhale 2 puffs into the lungs every 6 (six) hours as needed for wheezing or shortness of breath.    Dispense:  18 g    Refill:  1    Follow-up: Return in about 4 months (around 03/13/2025)  for Hypothyroidism.    Suzzane MARLA Blanch, MD

## 2024-11-12 NOTE — Patient Instructions (Signed)
 Please take Sucralfate  as needed for persistent acid reflux.  Please start taking protein supplement at least once daily.  Please continue to take medications as prescribed.  Please continue to follow high protein diet and perform moderate exercise/walking at least 150 mins/week.

## 2024-11-13 ENCOUNTER — Ambulatory Visit: Payer: Self-pay | Admitting: Internal Medicine

## 2024-11-13 LAB — CBC WITH DIFFERENTIAL/PLATELET
Basophils Absolute: 0.1 x10E3/uL (ref 0.0–0.2)
Basos: 2 %
EOS (ABSOLUTE): 0.5 x10E3/uL — ABNORMAL HIGH (ref 0.0–0.4)
Eos: 7 %
Hematocrit: 39.2 % (ref 34.0–46.6)
Hemoglobin: 12.3 g/dL (ref 11.1–15.9)
Immature Grans (Abs): 0 x10E3/uL (ref 0.0–0.1)
Immature Granulocytes: 0 %
Lymphocytes Absolute: 2.8 x10E3/uL (ref 0.7–3.1)
Lymphs: 44 %
MCH: 29.1 pg (ref 26.6–33.0)
MCHC: 31.4 g/dL — ABNORMAL LOW (ref 31.5–35.7)
MCV: 93 fL (ref 79–97)
Monocytes Absolute: 0.5 x10E3/uL (ref 0.1–0.9)
Monocytes: 8 %
Neutrophils Absolute: 2.4 x10E3/uL (ref 1.4–7.0)
Neutrophils: 39 %
Platelets: 277 x10E3/uL (ref 150–450)
RBC: 4.22 x10E6/uL (ref 3.77–5.28)
RDW: 12.9 % (ref 11.7–15.4)
WBC: 6.3 x10E3/uL (ref 3.4–10.8)

## 2024-11-13 LAB — CMP14+EGFR
ALT: 12 IU/L (ref 0–32)
AST: 16 IU/L (ref 0–40)
Albumin: 4.1 g/dL (ref 3.9–4.9)
Alkaline Phosphatase: 83 IU/L (ref 49–135)
BUN/Creatinine Ratio: 14 (ref 12–28)
BUN: 12 mg/dL (ref 8–27)
Bilirubin Total: 0.2 mg/dL (ref 0.0–1.2)
CO2: 25 mmol/L (ref 20–29)
Calcium: 8.2 mg/dL — ABNORMAL LOW (ref 8.7–10.3)
Chloride: 104 mmol/L (ref 96–106)
Creatinine, Ser: 0.87 mg/dL (ref 0.57–1.00)
Globulin, Total: 2.6 g/dL (ref 1.5–4.5)
Glucose: 63 mg/dL — ABNORMAL LOW (ref 70–99)
Potassium: 3.4 mmol/L — ABNORMAL LOW (ref 3.5–5.2)
Sodium: 145 mmol/L — ABNORMAL HIGH (ref 134–144)
Total Protein: 6.7 g/dL (ref 6.0–8.5)
eGFR: 72 mL/min/1.73

## 2024-11-13 LAB — TSH+T4F+T3FREE
Free T4: 1.08 ng/dL (ref 0.82–1.77)
T3, Free: 2.2 pg/mL (ref 2.0–4.4)
TSH: 2.52 u[IU]/mL (ref 0.450–4.500)

## 2024-11-13 MED ORDER — LEVOTHYROXINE SODIUM 150 MCG PO TABS: 150.0000 ug | ORAL_TABLET | Freq: Every day | ORAL | 1 refills | Status: AC

## 2024-11-13 NOTE — Addendum Note (Signed)
 Addended byBETHA TOBIE DOWNS on: 11/13/2024 08:13 AM   Modules accepted: Orders

## 2024-11-17 ENCOUNTER — Emergency Department (HOSPITAL_COMMUNITY)
Admission: EM | Admit: 2024-11-17 | Discharge: 2024-11-17 | Disposition: A | Discharge status: Home / Self Care | Attending: Emergency Medicine | Admitting: Emergency Medicine

## 2024-11-17 ENCOUNTER — Encounter (HOSPITAL_COMMUNITY): Payer: Self-pay

## 2024-11-17 ENCOUNTER — Emergency Department (HOSPITAL_COMMUNITY)

## 2024-11-17 ENCOUNTER — Other Ambulatory Visit: Payer: Self-pay

## 2024-11-17 DIAGNOSIS — S99921A Unspecified injury of right foot, initial encounter: Secondary | ICD-10-CM | POA: Diagnosis present

## 2024-11-17 DIAGNOSIS — Y92093 Driveway of other non-institutional residence as the place of occurrence of the external cause: Secondary | ICD-10-CM | POA: Diagnosis not present

## 2024-11-17 DIAGNOSIS — J45909 Unspecified asthma, uncomplicated: Secondary | ICD-10-CM | POA: Insufficient documentation

## 2024-11-17 DIAGNOSIS — I10 Essential (primary) hypertension: Secondary | ICD-10-CM | POA: Insufficient documentation

## 2024-11-17 MED ORDER — KETOROLAC TROMETHAMINE 15 MG/ML IJ SOLN
15.0000 mg | Freq: Once | INTRAMUSCULAR | Status: AC
  Administered 2024-11-17: 15 mg via INTRAMUSCULAR
  Filled 2024-11-17: qty 1

## 2024-11-17 MED ORDER — ACETAMINOPHEN 500 MG PO TABS
1000.0000 mg | ORAL_TABLET | Freq: Once | ORAL | Status: AC
  Administered 2024-11-17: 1000 mg via ORAL
  Filled 2024-11-17: qty 2
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
## 2024-11-17 NOTE — Discharge Instructions (Signed)
 You were evaluated in the emergency room for a foot injury .You may use Tylenol  1000 mg and/or Motrin  600 mg every 4-6 hours up to 3 times a day for fever or body aches.  Please keep in mind that this dosing is not meant to be continued long-term.  If your symptoms persist please follow-up with the orthopedic doctor.

## 2024-12-25 ENCOUNTER — Other Ambulatory Visit: Payer: Self-pay | Admitting: Internal Medicine

## 2024-12-25 DIAGNOSIS — J4520 Mild intermittent asthma, uncomplicated: Secondary | ICD-10-CM

## 2025-03-17 ENCOUNTER — Ambulatory Visit: Payer: Self-pay | Admitting: Internal Medicine

## 2025-09-14 ENCOUNTER — Ambulatory Visit
